# Patient Record
Sex: Female | Born: 1935 | Race: White | Hispanic: No | Smoking: Never smoker
Health system: Southern US, Community
[De-identification: ages and names within clinical notes are randomized; demographics above are authoritative.]

## PROBLEM LIST (undated history)

## (undated) DIAGNOSIS — D649 Anemia, unspecified: Secondary | ICD-10-CM

## (undated) DIAGNOSIS — I10 Essential (primary) hypertension: Secondary | ICD-10-CM

## (undated) DIAGNOSIS — J449 Chronic obstructive pulmonary disease, unspecified: Secondary | ICD-10-CM

## (undated) DIAGNOSIS — R269 Unspecified abnormalities of gait and mobility: Secondary | ICD-10-CM

## (undated) DIAGNOSIS — I472 Ventricular tachycardia: Secondary | ICD-10-CM

## (undated) DIAGNOSIS — R0602 Shortness of breath: Secondary | ICD-10-CM

## (undated) DIAGNOSIS — R5381 Other malaise: Secondary | ICD-10-CM

## (undated) DIAGNOSIS — F341 Dysthymic disorder: Secondary | ICD-10-CM

## (undated) DIAGNOSIS — R251 Tremor, unspecified: Secondary | ICD-10-CM

## (undated) DIAGNOSIS — I4891 Unspecified atrial fibrillation: Secondary | ICD-10-CM

## (undated) DIAGNOSIS — I5032 Chronic diastolic (congestive) heart failure: Secondary | ICD-10-CM

## (undated) DIAGNOSIS — R5383 Other fatigue: Secondary | ICD-10-CM

## (undated) DIAGNOSIS — H409 Unspecified glaucoma: Secondary | ICD-10-CM

## (undated) DIAGNOSIS — Z950 Presence of cardiac pacemaker: Secondary | ICD-10-CM

## (undated) DIAGNOSIS — I890 Lymphedema, not elsewhere classified: Secondary | ICD-10-CM

## (undated) DIAGNOSIS — J4489 Other specified chronic obstructive pulmonary disease: Secondary | ICD-10-CM

## (undated) DIAGNOSIS — C50919 Malignant neoplasm of unspecified site of unspecified female breast: Secondary | ICD-10-CM

## (undated) DIAGNOSIS — R439 Unspecified disturbances of smell and taste: Secondary | ICD-10-CM

## (undated) DIAGNOSIS — Z8719 Personal history of other diseases of the digestive system: Secondary | ICD-10-CM

## (undated) DIAGNOSIS — R413 Other amnesia: Secondary | ICD-10-CM

## (undated) DIAGNOSIS — K219 Gastro-esophageal reflux disease without esophagitis: Secondary | ICD-10-CM

## (undated) DIAGNOSIS — I442 Atrioventricular block, complete: Secondary | ICD-10-CM

## (undated) HISTORY — DX: Gastro-esophageal reflux disease without esophagitis: K21.9

## (undated) HISTORY — DX: Other fatigue: R53.83

## (undated) HISTORY — DX: Chronic diastolic (congestive) heart failure: I50.32

## (undated) HISTORY — DX: Chronic obstructive pulmonary disease, unspecified: J44.9

## (undated) HISTORY — DX: Tremor, unspecified: R25.1

## (undated) HISTORY — DX: Atrioventricular block, complete: I44.2

## (undated) HISTORY — DX: Lymphedema, not elsewhere classified: I89.0

## (undated) HISTORY — DX: Other specified chronic obstructive pulmonary disease: J44.89

## (undated) HISTORY — PX: OTHER SURGICAL HISTORY: SHX169

## (undated) HISTORY — DX: Other amnesia: R41.3

## (undated) HISTORY — PX: SHOULDER SURGERY: SHX246

## (undated) HISTORY — DX: Other malaise: R53.81

## (undated) HISTORY — PX: PARTIAL COLECTOMY: SHX5273

## (undated) HISTORY — DX: Personal history of other diseases of the digestive system: Z87.19

## (undated) HISTORY — DX: Presence of cardiac pacemaker: Z95.0

## (undated) HISTORY — DX: Essential (primary) hypertension: I10

## (undated) HISTORY — DX: Unspecified glaucoma: H40.9

## (undated) HISTORY — DX: Unspecified disturbances of smell and taste: R43.9

## (undated) HISTORY — DX: Unspecified atrial fibrillation: I48.91

## (undated) HISTORY — DX: Ventricular tachycardia: I47.2

## (undated) HISTORY — DX: Malignant neoplasm of unspecified site of unspecified female breast: C50.919

## (undated) HISTORY — DX: Shortness of breath: R06.02

## (undated) HISTORY — PX: MASTECTOMY: SHX3

## (undated) HISTORY — DX: Unspecified abnormalities of gait and mobility: R26.9

## (undated) HISTORY — DX: Anemia, unspecified: D64.9

## (undated) HISTORY — DX: Dysthymic disorder: F34.1

---

## 2000-02-12 HISTORY — PX: MITRAL VALVE REPAIR: SHX2039

## 2006-10-07 ENCOUNTER — Encounter: Payer: Self-pay | Admitting: Cardiovascular Disease

## 2008-12-13 ENCOUNTER — Encounter: Payer: Self-pay | Admitting: Cardiovascular Disease

## 2008-12-30 ENCOUNTER — Encounter: Payer: Self-pay | Admitting: Cardiovascular Disease

## 2009-03-02 ENCOUNTER — Encounter: Payer: Self-pay | Admitting: Cardiovascular Disease

## 2009-04-05 ENCOUNTER — Encounter: Admission: RE | Admit: 2009-04-05 | Discharge: 2009-06-05 | Payer: Self-pay | Admitting: Hematology and Oncology

## 2009-05-03 ENCOUNTER — Encounter: Payer: Self-pay | Admitting: Cardiovascular Disease

## 2009-06-05 ENCOUNTER — Telehealth (INDEPENDENT_AMBULATORY_CARE_PROVIDER_SITE_OTHER): Payer: Self-pay | Admitting: *Deleted

## 2009-06-05 ENCOUNTER — Ambulatory Visit: Payer: Self-pay | Admitting: Cardiovascular Disease

## 2009-06-05 DIAGNOSIS — F341 Dysthymic disorder: Secondary | ICD-10-CM

## 2009-06-05 DIAGNOSIS — K219 Gastro-esophageal reflux disease without esophagitis: Secondary | ICD-10-CM | POA: Insufficient documentation

## 2009-06-05 DIAGNOSIS — C50919 Malignant neoplasm of unspecified site of unspecified female breast: Secondary | ICD-10-CM | POA: Insufficient documentation

## 2009-06-20 ENCOUNTER — Encounter: Payer: Self-pay | Admitting: Cardiovascular Disease

## 2009-06-21 ENCOUNTER — Telehealth: Payer: Self-pay | Admitting: Cardiovascular Disease

## 2009-06-26 ENCOUNTER — Encounter: Payer: Self-pay | Admitting: Cardiovascular Disease

## 2009-06-26 LAB — CONVERTED CEMR LAB
POC INR: 2.4
Prothrombin Time: 25.1 s

## 2009-07-28 ENCOUNTER — Ambulatory Visit: Payer: Self-pay | Admitting: Internal Medicine

## 2009-07-28 DIAGNOSIS — H409 Unspecified glaucoma: Secondary | ICD-10-CM | POA: Insufficient documentation

## 2009-07-28 DIAGNOSIS — K130 Diseases of lips: Secondary | ICD-10-CM

## 2009-07-28 DIAGNOSIS — Z8719 Personal history of other diseases of the digestive system: Secondary | ICD-10-CM

## 2009-07-28 DIAGNOSIS — I890 Lymphedema, not elsewhere classified: Secondary | ICD-10-CM | POA: Insufficient documentation

## 2009-07-28 DIAGNOSIS — D649 Anemia, unspecified: Secondary | ICD-10-CM

## 2009-07-28 LAB — CONVERTED CEMR LAB
ALT: 18 units/L (ref 0–35)
AST: 24 units/L (ref 0–37)
Albumin: 4.2 g/dL (ref 3.5–5.2)
Alkaline Phosphatase: 66 units/L (ref 39–117)
BUN: 13 mg/dL (ref 6–23)
Basophils Absolute: 0 10*3/uL (ref 0.0–0.1)
Basophils Relative: 0.3 % (ref 0.0–3.0)
Bilirubin Urine: NEGATIVE
Bilirubin, Direct: 0.1 mg/dL (ref 0.0–0.3)
CO2: 26 meq/L (ref 19–32)
Calcium: 9.9 mg/dL (ref 8.4–10.5)
Chloride: 106 meq/L (ref 96–112)
Cholesterol: 252 mg/dL — ABNORMAL HIGH (ref 0–200)
Creatinine, Ser: 0.6 mg/dL (ref 0.4–1.2)
Direct LDL: 145.9 mg/dL
Eosinophils Absolute: 0.1 10*3/uL (ref 0.0–0.7)
Eosinophils Relative: 1.6 % (ref 0.0–5.0)
Folate: >20 ng/mL
GFR calc non Af Amer: 103.27 mL/min (ref >60)
Glucose, Bld: 89 mg/dL (ref 70–99)
HCT: 43.5 % (ref 36.0–46.0)
HDL: 93.8 mg/dL (ref >39.00)
Hemoglobin: 15.5 g/dL — ABNORMAL HIGH (ref 12.0–15.0)
Iron: 125 ug/dL (ref 42–145)
Ketones, ur: NEGATIVE mg/dL
Leukocytes, UA: NEGATIVE
Lymphocytes Relative: 34 % (ref 12.0–46.0)
Lymphs Abs: 2.2 10*3/uL (ref 0.7–4.0)
MCHC: 35.6 g/dL (ref 30.0–36.0)
MCV: 95.8 fL (ref 78.0–100.0)
Monocytes Absolute: 0.6 10*3/uL (ref 0.1–1.0)
Monocytes Relative: 9.7 % (ref 3.0–12.0)
Neutro Abs: 3.6 10*3/uL (ref 1.4–7.7)
Neutrophils Relative %: 54.4 % (ref 43.0–77.0)
Nitrite: NEGATIVE
Platelets: 215 10*3/uL (ref 150.0–400.0)
Potassium: 4 meq/L (ref 3.5–5.1)
RBC: 4.54 M/uL (ref 3.87–5.11)
RDW: 13.3 % (ref 11.5–14.6)
Saturation Ratios: 33.6 % (ref 20.0–50.0)
Sodium: 141 meq/L (ref 135–145)
Specific Gravity, Urine: 1.01 (ref 1.000–1.030)
TSH: 0.87 microintl units/mL (ref 0.35–5.50)
Total Bilirubin: 0.6 mg/dL (ref 0.3–1.2)
Total CHOL/HDL Ratio: 3
Total Protein, Urine: NEGATIVE mg/dL
Total Protein: 7.6 g/dL (ref 6.0–8.3)
Transferrin: 265.5 mg/dL (ref 212.0–360.0)
Triglycerides: 114 mg/dL (ref 0.0–149.0)
Urine Glucose: NEGATIVE mg/dL
Urobilinogen, UA: 0.2 (ref 0.0–1.0)
VLDL: 22.8 mg/dL (ref 0.0–40.0)
Vitamin B-12: 756 pg/mL (ref 211–911)
WBC: 6.6 10*3/uL (ref 4.5–10.5)
pH: 5 (ref 5.0–8.0)

## 2009-08-02 ENCOUNTER — Ambulatory Visit: Payer: Self-pay

## 2009-08-02 LAB — CONVERTED CEMR LAB: POC INR: 1.7

## 2009-08-09 ENCOUNTER — Telehealth: Payer: Self-pay | Admitting: Cardiovascular Disease

## 2009-08-15 ENCOUNTER — Ambulatory Visit: Payer: Self-pay | Admitting: Internal Medicine

## 2009-08-15 DIAGNOSIS — I5032 Chronic diastolic (congestive) heart failure: Secondary | ICD-10-CM

## 2009-08-16 ENCOUNTER — Encounter: Payer: Self-pay | Admitting: Cardiovascular Disease

## 2009-08-16 ENCOUNTER — Telehealth: Payer: Self-pay | Admitting: Internal Medicine

## 2009-08-18 ENCOUNTER — Encounter: Payer: Self-pay | Admitting: Cardiovascular Disease

## 2009-08-18 ENCOUNTER — Ambulatory Visit: Payer: Self-pay | Admitting: Cardiovascular Disease

## 2009-08-18 ENCOUNTER — Ambulatory Visit (HOSPITAL_COMMUNITY): Admission: RE | Admit: 2009-08-18 | Discharge: 2009-08-18 | Payer: Self-pay | Admitting: Cardiovascular Disease

## 2009-08-18 LAB — CONVERTED CEMR LAB
BUN: 14 mg/dL (ref 6–23)
Basophils Absolute: 0 10*3/uL (ref 0.0–0.1)
Basophils Relative: 0.3 % (ref 0.0–3.0)
CO2: 28 meq/L (ref 19–32)
Calcium: 9.2 mg/dL (ref 8.4–10.5)
Chloride: 104 meq/L (ref 96–112)
Creatinine, Ser: 0.5 mg/dL (ref 0.4–1.2)
Eosinophils Absolute: 0 10*3/uL (ref 0.0–0.7)
Eosinophils Relative: 0.4 % (ref 0.0–5.0)
GFR calc non Af Amer: 114.36 mL/min (ref >60)
Glucose, Bld: 94 mg/dL (ref 70–99)
HCT: 42.4 % (ref 36.0–46.0)
Hemoglobin: 14.9 g/dL (ref 12.0–15.0)
INR: 2.9 — ABNORMAL HIGH (ref 0.8–1.0)
Lymphocytes Relative: 19 % (ref 12.0–46.0)
Lymphs Abs: 1.5 10*3/uL (ref 0.7–4.0)
MCHC: 35.1 g/dL (ref 30.0–36.0)
MCV: 96.6 fL (ref 78.0–100.0)
Monocytes Absolute: 0.7 10*3/uL (ref 0.1–1.0)
Monocytes Relative: 8.4 % (ref 3.0–12.0)
Neutro Abs: 5.9 10*3/uL (ref 1.4–7.7)
Neutrophils Relative %: 71.9 % (ref 43.0–77.0)
Platelets: 227 10*3/uL (ref 150.0–400.0)
Potassium: 4.6 meq/L (ref 3.5–5.1)
Prothrombin Time: 30.8 s — ABNORMAL HIGH (ref 9.7–11.8)
RBC: 4.39 M/uL (ref 3.87–5.11)
RDW: 13.9 % (ref 11.5–14.6)
Sodium: 139 meq/L (ref 135–145)
WBC: 8.2 10*3/uL (ref 4.5–10.5)
aPTT: 41.3 s — ABNORMAL HIGH (ref 21.7–28.8)

## 2009-08-24 ENCOUNTER — Ambulatory Visit: Payer: Self-pay | Admitting: Internal Medicine

## 2009-08-24 DIAGNOSIS — R5383 Other fatigue: Secondary | ICD-10-CM

## 2009-08-24 DIAGNOSIS — R5381 Other malaise: Secondary | ICD-10-CM

## 2009-08-24 LAB — CONVERTED CEMR LAB
BUN: 12 mg/dL (ref 6–23)
CO2: 26 meq/L (ref 19–32)
Calcium: 9.3 mg/dL (ref 8.4–10.5)
Chloride: 105 meq/L (ref 96–112)
Creatinine, Ser: 0.6 mg/dL (ref 0.4–1.2)
GFR calc non Af Amer: 105.31 mL/min (ref >60)
Glucose, Bld: 106 mg/dL — ABNORMAL HIGH (ref 70–99)
Potassium: 4 meq/L (ref 3.5–5.1)
Sodium: 139 meq/L (ref 135–145)

## 2009-08-29 ENCOUNTER — Telehealth: Payer: Self-pay | Admitting: Internal Medicine

## 2009-08-29 ENCOUNTER — Telehealth (INDEPENDENT_AMBULATORY_CARE_PROVIDER_SITE_OTHER): Payer: Self-pay | Admitting: *Deleted

## 2009-09-18 ENCOUNTER — Telehealth: Payer: Self-pay | Admitting: Internal Medicine

## 2009-09-18 ENCOUNTER — Encounter: Payer: Self-pay | Admitting: Internal Medicine

## 2009-09-19 ENCOUNTER — Encounter: Payer: Self-pay | Admitting: Cardiology

## 2009-09-19 ENCOUNTER — Ambulatory Visit: Payer: Self-pay | Admitting: Internal Medicine

## 2009-09-19 DIAGNOSIS — R000 Tachycardia, unspecified: Secondary | ICD-10-CM | POA: Insufficient documentation

## 2009-09-19 LAB — CONVERTED CEMR LAB: POC INR: 2.1

## 2009-09-21 ENCOUNTER — Ambulatory Visit: Payer: Self-pay | Admitting: Internal Medicine

## 2009-09-21 ENCOUNTER — Encounter: Payer: Self-pay | Admitting: Cardiovascular Disease

## 2009-09-21 DIAGNOSIS — R0602 Shortness of breath: Secondary | ICD-10-CM

## 2009-09-22 ENCOUNTER — Ambulatory Visit (HOSPITAL_COMMUNITY)
Admission: RE | Admit: 2009-09-22 | Discharge: 2009-09-22 | Payer: Self-pay | Source: Home / Self Care | Admitting: Cardiology

## 2009-09-22 ENCOUNTER — Telehealth: Payer: Self-pay | Admitting: Nurse Practitioner

## 2009-09-25 ENCOUNTER — Ambulatory Visit: Payer: Self-pay | Admitting: Cardiology

## 2009-09-25 LAB — CONVERTED CEMR LAB: POC INR: 3.2

## 2009-09-26 ENCOUNTER — Ambulatory Visit: Payer: Self-pay | Admitting: Internal Medicine

## 2009-09-26 ENCOUNTER — Ambulatory Visit (HOSPITAL_COMMUNITY): Admission: RE | Admit: 2009-09-26 | Discharge: 2009-09-26 | Payer: Self-pay | Admitting: Internal Medicine

## 2009-09-26 ENCOUNTER — Encounter: Payer: Self-pay | Admitting: Internal Medicine

## 2009-09-27 ENCOUNTER — Encounter: Payer: Self-pay | Admitting: Internal Medicine

## 2009-09-27 ENCOUNTER — Telehealth: Payer: Self-pay | Admitting: Internal Medicine

## 2009-09-28 ENCOUNTER — Ambulatory Visit: Payer: Self-pay | Admitting: Internal Medicine

## 2009-09-29 ENCOUNTER — Telehealth: Payer: Self-pay | Admitting: Internal Medicine

## 2009-10-02 ENCOUNTER — Encounter: Payer: Self-pay | Admitting: Internal Medicine

## 2009-10-02 ENCOUNTER — Encounter (HOSPITAL_COMMUNITY)
Admission: RE | Admit: 2009-10-02 | Discharge: 2009-11-02 | Payer: Self-pay | Source: Home / Self Care | Admitting: Internal Medicine

## 2009-10-02 ENCOUNTER — Ambulatory Visit: Payer: Self-pay

## 2009-10-02 ENCOUNTER — Ambulatory Visit: Payer: Self-pay | Admitting: Internal Medicine

## 2009-10-02 ENCOUNTER — Ambulatory Visit: Payer: Self-pay | Admitting: Cardiology

## 2009-10-02 LAB — CONVERTED CEMR LAB: POC INR: 4.9

## 2009-10-03 ENCOUNTER — Telehealth: Payer: Self-pay | Admitting: Internal Medicine

## 2009-10-09 ENCOUNTER — Ambulatory Visit: Payer: Self-pay | Admitting: Cardiology

## 2009-10-12 ENCOUNTER — Telehealth: Payer: Self-pay | Admitting: Internal Medicine

## 2009-10-19 ENCOUNTER — Ambulatory Visit: Payer: Self-pay | Admitting: Internal Medicine

## 2009-10-19 ENCOUNTER — Telehealth (INDEPENDENT_AMBULATORY_CARE_PROVIDER_SITE_OTHER): Payer: Self-pay | Admitting: *Deleted

## 2009-11-09 ENCOUNTER — Ambulatory Visit: Payer: Self-pay | Admitting: Cardiology

## 2009-11-21 ENCOUNTER — Ambulatory Visit: Payer: Self-pay | Admitting: Internal Medicine

## 2009-11-21 ENCOUNTER — Encounter: Payer: Self-pay | Admitting: Internal Medicine

## 2009-12-25 ENCOUNTER — Ambulatory Visit: Payer: Self-pay | Admitting: Internal Medicine

## 2010-01-22 ENCOUNTER — Ambulatory Visit: Payer: Self-pay | Admitting: Cardiology

## 2010-01-22 LAB — CONVERTED CEMR LAB: POC INR: 3.1

## 2010-02-06 ENCOUNTER — Telehealth: Payer: Self-pay | Admitting: Internal Medicine

## 2010-02-07 ENCOUNTER — Ambulatory Visit: Admit: 2010-02-07 | Payer: Self-pay | Admitting: Cardiology

## 2010-02-08 ENCOUNTER — Ambulatory Visit
Admission: RE | Admit: 2010-02-08 | Discharge: 2010-02-08 | Payer: Self-pay | Source: Home / Self Care | Attending: Internal Medicine | Admitting: Internal Medicine

## 2010-02-08 ENCOUNTER — Telehealth: Payer: Self-pay | Admitting: Internal Medicine

## 2010-02-08 ENCOUNTER — Encounter: Payer: Self-pay | Admitting: Internal Medicine

## 2010-02-11 HISTORY — PX: PACEMAKER INSERTION: SHX728

## 2010-02-13 LAB — CONVERTED CEMR LAB
Basophils Absolute: 0 10*3/uL (ref 0.0–0.1)
CO2: 23 meq/L (ref 19–32)
Calcium: 9.1 mg/dL (ref 8.4–10.5)
Creatinine, Ser: 0.6 mg/dL (ref 0.4–1.2)
Eosinophils Absolute: 0.1 10*3/uL (ref 0.0–0.7)
Eosinophils Relative: 1.2 % (ref 0.0–5.0)
MCV: 98.8 fL (ref 78.0–100.0)
Monocytes Absolute: 0.8 10*3/uL (ref 0.1–1.0)
Neutrophils Relative %: 73.7 % (ref 43.0–77.0)
Platelets: 147 10*3/uL — ABNORMAL LOW (ref 150.0–400.0)
Pro B Natriuretic peptide (BNP): 467.6 pg/mL — ABNORMAL HIGH (ref 0.0–100.0)
WBC: 8.6 10*3/uL (ref 4.5–10.5)

## 2010-02-19 ENCOUNTER — Ambulatory Visit: Admission: RE | Admit: 2010-02-19 | Discharge: 2010-02-19 | Payer: Self-pay | Source: Home / Self Care

## 2010-02-19 LAB — CONVERTED CEMR LAB: POC INR: 1.9

## 2010-02-27 ENCOUNTER — Encounter: Payer: Self-pay | Admitting: Internal Medicine

## 2010-02-27 ENCOUNTER — Ambulatory Visit
Admission: RE | Admit: 2010-02-27 | Discharge: 2010-02-27 | Payer: Self-pay | Source: Home / Self Care | Attending: Internal Medicine | Admitting: Internal Medicine

## 2010-02-27 ENCOUNTER — Other Ambulatory Visit: Payer: Self-pay | Admitting: Internal Medicine

## 2010-02-28 LAB — HEPATIC FUNCTION PANEL
ALT: 20 U/L (ref 0–35)
AST: 24 U/L (ref 0–37)
Albumin: 3.8 g/dL (ref 3.5–5.2)
Alkaline Phosphatase: 69 U/L (ref 39–117)
Bilirubin, Direct: 0.1 mg/dL (ref 0.0–0.3)
Total Bilirubin: 0.5 mg/dL (ref 0.3–1.2)
Total Protein: 7 g/dL (ref 6.0–8.3)

## 2010-02-28 LAB — TSH: TSH: 1.3 u[IU]/mL (ref 0.35–5.50)

## 2010-03-08 ENCOUNTER — Ambulatory Visit: Admission: RE | Admit: 2010-03-08 | Discharge: 2010-03-08 | Payer: Self-pay | Source: Home / Self Care

## 2010-03-08 LAB — CONVERTED CEMR LAB: POC INR: 2.5

## 2010-03-13 NOTE — Progress Notes (Signed)
Summary: Episodes of AFib  Phone Note Call from Patient Call back at [phone]   Reason for Call: Talk to Nurse Initial call taken by: Judie Grieve,  August 09, 2009 11:09 AM  Follow-up for Phone Call        pt having episodes of afib, request call back, Migdalia Dk  August 09, 2009 3:54 PM   I spoke with the pt and she said her heart was going "crazy" yesterday.  The pt did not count her pulse but she thinks it was over 100.  The pt feels fine today.  The pt is currently in [place].  I asked the pt if she had taken her prn metoprolol.  The pt said that she did not bring it with her on the trip.  I made her aware that her pharmacy contacted me yesterday about transferring a Rx to an [facility] pharmacy.  The pt will contact  the pharmacy. The pt said she will be back in town on Sunday and is going back out of town on Thursday.  The pt would like to see someone in the office before going back out of town next week. I will have to review schedules in the office for availability next week and then contact the pt with an appt.   Julieta Gutting, RN, BSN,  August 09, 2009 4:25 PM  pt wants to talk w/nurse re medication-said she has been in atrial fib all week-pt in [place] right now be home saturday-pls call [phone] Glynda Jaeger   I spoke with the pt and she is continuing to have problems with episodes of AFib.  The pt had another episode yesterday and then again today.  The pt was unsure if she was suppose to use Metoprolol for these episodes.  I instructed her that we do want her to take Metoprolol prn for palpitations. The pt did try the Metoprolol but it makes her tired.  The pt would like to be seen on Tuesday for evaluation.  I scheduled the pt to see Dr Graciela Husbands (DOD) on 08/15/09.     Follow-up by: Julieta Gutting, RN, BSN,  August 11, 2009 4:10 PM

## 2010-03-13 NOTE — Progress Notes (Signed)
Summary: Patient compliant  Patient complaint involving medical records not getting sent in a timely matter to [facility]. ROI received 10/19/09. Records biscom faxed to facility. [name] Flowers  October 19, 2009 4:20 PM

## 2010-03-13 NOTE — Assessment & Plan Note (Signed)
Summary: per check out/sf   Primary [name]:  Corwin Levins MD   History of Present Illness: [name] is seen in followup for AF for which she underwernt cardioversion which was undertaken somewhat urgently because of symptoms of palpitations and congestive failure manifested by fatigue and edema. Iitially she did not improve; she also suffered reverted to atrial fibrillation and underwent repeat cardioversion supported on this occasion with Rythmol. She failed to tolerate this. She failed to tolerate flecainide. She currently is not taking any antiarrhythmic drugs at all except for the fact that she mistook her flecainide for her antidepressants were last couple of days.   Notwithstanding this, she currently he is feeling much better. Her energy level is improved.   s   Current Medications (verified): 1)  Clonazepam 1 Mg Tabs (Clonazepam) .... As Needed 2)  Warfarin Sodium 5 Mg Tabs (Warfarin Sodium) .... Use As Directed By Anticoagulation Clinic 3)  Vitamin B-12 1000 Mcg Tabs (Cyanocobalamin) .... Take 1 Tablet By Mouth Once A Day 4)  Advair Diskus 250-50 Mcg/dose Aepb (Fluticasone-Salmeterol) .Marland Kitchen.. 1 Puff Two Times A Day 5)  Ambien 10 Mg Tabs (Zolpidem Tartrate) .Marland Kitchen.. 1 By Mouth At Bedtime As Needed 6)  Timolol Maleate 0.5 % Soln (Timolol Maleate) .Marland Kitchen.. 1 Drop in Both Eyes Every Morning and Evening 7)  Lotrisone 1-0.05 % Crea (Clotrimazole-Betamethasone) .... Use Asd Two Times A Day As Needed 8)  Clotrimazole 1 % Crea (Clotrimazole) .... Uad 9)  Flecainide Acetate 100 Mg Tabs (Flecainide Acetate) .... 1/2 Tab Two Times A Day X 2 Weeks Then Increase To 1 Two Times A Day  Allergies (verified): 1)  ! Codeine 2)  ! * Propafenone Hcl  Past History:  Past Medical History: Last updated: 08/24/2009 atrial fibrillation Sinus bradycardia Near-normal left ventricular function by TEE 7 2011 ACID REFLUX DISEASE (ICD-530.81) DEPRESSION/ANXIETY (ICD-300.4) BREAST CANCER (ICD-174.9) COPD  (ICD-496) chronic RUE lymphedema Diverticulitis, hx of glaucoma  -  Dr Severiano Gilbert - [place], optho Anemia-NOS - post -op - s/p transfusion  Past Surgical History: Last updated: 08/24/2009 Mitral valve repair 2002 Esophagogastrodiodenoscopy Partial colectomy Shoulder surgery Tonsillectomy Sigmoidoscopy Mastectomy right breast  Family History: Last updated: 06/15/2009 Mother deceased at age 81 -old age Father died at 92 in a car accident One sister dead at age 81 Uterine cancer  Social History: Last updated: 07/28/2009 10 children Retired - homemaker Never smoke Glass of vodka and tonic every day, occas wine with dinner No Hx of drug use Widowed   Vital Signs:  Patient profile:   77 year old female Height:      62 inches Weight:      133 pounds BMI:     24.41 Pulse rate:   60 / minute Resp:     16 per minute BP sitting:   112 / 76  (right arm)  Vitals Entered By: Marrion Coy, CNA (November 21, 2009 2:19 PM)  Physical Exam  General:  The patient was alert and oriented in no acute distress.Neck veins were flat, carotids were brisk. Lungs were clear. Heart sounds were irregular without murmurs or gallops. Abdomen was soft with active bowel sounds. There is no clubbing cyanosis or edema.   Appended Document: [facility]     Anticoagulation Management History:      Positive risk factors for bleeding include an age of 28 years or older.  The bleeding index is 'intermediate risk'.  Positive CHADS2 values include History of CHF and Age > 36 years old.  Her last INR was 2.9 ratio.  Anticoagulation responsible [name]: [name] MD, [place].  Exp: 12/2010.    Allergies: 1)  ! Codeine 2)  ! * Propafenone Hcl   Impression & Recommendations:  Problem # 1:  ATRIAL FIBRILLATION/FLUTTER (ICD-427.31) Paroxsyms of atrial fibrillation now in sinus.  she has been taking her flecanide by mistake as she did not tolerate it in Aug I am not sanguine about this time either.   However she is willing to try it and I am willing to give it a well as well. In the event that she cannot tolerated the options would either be dofetilide or amiodarone;  we. also discussed Pradaxa; however, given her age I am reluctant to make a change Her updated medication list for this problem includes:    Warfarin Sodium 5 Mg Tabs (Warfarin sodium) ..... Use as directed by anticoagulation clinic    Flecainide Acetate 100 Mg Tabs (Flecainide acetate) .Marland Kitchen... 1/2 tab two times a day x 2 weeks then increase to 1 two times a day  Problem # 2:  SHORTNESS OF BREATH (ICD-786.05) this is betteer in sinus rhtyhm  Other Orders: EKG w/ Interpretation (93000)  Anticoagulation Management Assessment/Plan:      The patient's current anticoagulation dose is Warfarin sodium 5 mg tabs: Use as directed by Anticoagulation Clinic.  The target INR is 2.0-3.0.  The next INR is due 12/07/2009.  Anticoagulation instructions were given to patient.  Results were reviewed/authorized by Weston Brass, PharmD.         Prior Anticoagulation Instructions: INR 2.3  Continue taking one tablet every day except for one-half tablet on Monday, Wednesday, and Friday.  Recheck in four weeks.    Patient Instructions: 1)  Your physician recommends that you schedule a follow-up appointment in: 3 months 2)  Your physician recommends that you continue on your current medications as directed. Please refer to the Current Medication list given to you today. 3)  Call us on Friday if you wish a refill on Flecainide 50mg  two times a day.

## 2010-03-13 NOTE — Progress Notes (Signed)
Summary: pt wants to do cardioversion sooner   Phone Note Call from Patient Call back at Home Phone [phone]   Caller: Patient Reason for Call: Talk to Nurse, Talk to Doctor Summary of Call: pt wants to have cardioversion pushed up to this Friday 08/18/2009 if possible Initial call taken by: Omer Jack,  August 16, 2009 8:58 AM  Follow-up for Phone Call        I have spoken with Verlon Au in endo. She will check the OR schedule and call me back. Sherri Rad, RN, BSN  August 16, 2009 9:14 AM  Pt r/s to friday 08/18/09 @ 12:00pm with Dr. Eden Emms.  Follow-up by: Sherri Rad, RN, BSN,  August 16, 2009 9:21 AM

## 2010-03-13 NOTE — Medication Information (Signed)
Summary: ccr  Anticoagulant Therapy  Managed by: Weston Brass, PharmD Referring MD: Excell Seltzer PCP: Corwin Levins MD Supervising MD: Ladona Ridgel MD, Sharlot Gowda Indication 1: Atrial Fibrillation Indication 2: Mitral Valve Disorder  Lab Used: [facility] [place] Site: [address] INR POC 2.9 INR RANGE 2.0-3.0  Dietary changes: no    Health status changes: no    Bleeding/hemorrhagic complications: no    Recent/future hospitalizations: no    Any changes in medication regimen? no    Recent/future dental: no  Any missed doses?: no       Is patient compliant with meds? yes       Allergies: 1)  ! Codeine 2)  ! * Propafenone Hcl  Anticoagulation Management History:      The patient is taking warfarin and comes in today for a routine follow up visit.  Positive risk factors for bleeding include an age of 75 years or older.  The bleeding index is 'intermediate risk'.  Positive CHADS2 values include History of CHF and Age > 70 years old.  Her last INR was 2.9 ratio.  Anticoagulation responsible provider: Ladona Ridgel MD, Sharlot Gowda.  INR POC: 2.9.  Cuvette Lot#: [id number].  Exp: 01/2011.    Anticoagulation Management Assessment/Plan:      The patient's current anticoagulation dose is Warfarin sodium 5 mg tabs: Use as directed by Anticoagulation Clinic.  The target INR is 2.0-3.0.  The next INR is due 01/22/2010.  Anticoagulation instructions were given to patient.  Results were reviewed/authorized by Weston Brass, PharmD.  She was notified by Hoy Register, PharmD Candidate.         Prior Anticoagulation Instructions: INR 2.3  Continue taking one tablet every day except for one-half tablet on Monday, Wednesday, and Friday.  Recheck in four weeks.   Current Anticoagulation Instructions: INR 2.9 Continue previous dose of 1 tablet everyday except 0.5 tablet on Monday, Wednesday and Friday. Recheck INR in 4 weeks.

## 2010-03-13 NOTE — Medication Information (Signed)
Summary: rov/ewj  Anticoagulant Therapy  Managed by: Weston Brass, PharmD Referring MD: Excell Seltzer PCP: Corwin Levins MD Supervising MD: Antoine Poche MD, Fayrene Fearing Indication 1: Atrial Fibrillation Indication 2: Mitral Valve Disorder  Lab Used: [facility] [place] Site: [address] INR POC 4.9 INR RANGE 2.0-3.0  Dietary changes: no    Health status changes: no    Bleeding/hemorrhagic complications: no    Recent/future hospitalizations: yes       Details: Cardioversion last week  Any changes in medication regimen? no    Recent/future dental: no  Any missed doses?: no       Is patient compliant with meds? yes       Allergies: 1)  ! Codeine  Anticoagulation Management History:      The patient is taking warfarin and comes in today for a routine follow up visit.  Positive risk factors for bleeding include an age of 75 years or older.  The bleeding index is 'intermediate risk'.  Positive CHADS2 values include History of CHF and Age > 92 years old.  Her last INR was 2.9 ratio.  Anticoagulation responsible provider: Antoine Poche MD, Fayrene Fearing.  INR POC: 4.9.  Cuvette Lot#: [id number].  Exp: 11/2010.    Anticoagulation Management Assessment/Plan:      The patient's current anticoagulation dose is Warfarin sodium 5 mg tabs: Use as directed by Anticoagulation Clinic.  The target INR is 2.0-3.0.  The next INR is due 10/09/2009.  Anticoagulation instructions were given to patient.  Results were reviewed/authorized by Weston Brass, PharmD.  She was notified by Liana Gerold, PharmD Candidate.         Prior Anticoagulation Instructions: INR 3.2  Continue taking 1 tablet (5mg ) every day except take 1/2 tablet (2.5mg ) on Mondays, Wednesdays, and Fridays.  Recheck next week.   Current Anticoagulation Instructions: INR 4.9  Skip today's and tomorrow's dose of Coumadin.  On Wednesday begin with 1/2 tablet daily except 1 tablet on Sun, Tue and Thu.  Retun to clinic in 1 week.

## 2010-03-13 NOTE — Assessment & Plan Note (Signed)
Summary: pt having issues with AFib/lwb   Primary Provider:  Corwin Levins MD  CC:  pt having difficulty with afib and pt has a new bruise on right leg inside and outside near the knee.  Marland Kitchen  History of Present Illness: Mrs  [name] is an 77 year-old woman seen today by the doctor the day because of problems with atrial fibrillation.  His history of mitral valve disease and underwent repair minimally invasive approach at [facility] in 2001. In 2009 or 10 she developed atrial fibrillation with a rapid rate ventricular response and was treated with dronaderone simply discontinued because of itching and was then treated ongoing lay with his Coumadin. When she saw Dr. Excell Seltzer to establish care in April she had had no intercurrent palpitations.   she comes in today because of complaints of palpitations weakness and peripheral edema which has been present over the last couple of weeks and worsening. Exertion makes the palpitations worse   her INR last week was 1.7    Current Medications (verified): 1)  Clonazepam 1 Mg Tabs (Clonazepam) .... As Needed 2)  Warfarin Sodium 5 Mg Tabs (Warfarin Sodium) .... Use As Directed By Anticoagulation Clinic 3)  Vitamin B-12 1000 Mcg Tabs (Cyanocobalamin) .... Take 1 Tablet By Mouth Once A Day 4)  Metoprolol Tartrate 50 Mg Tabs (Metoprolol Tartrate) .... Take As Needed For Palpitations Pt Has Been Taking It For The Last Week. 5)  Advair Diskus 250-50 Mcg/dose Aepb (Fluticasone-Salmeterol) .Marland Kitchen.. 1 Puff Two Times A Day 6)  Ambien 10 Mg Tabs (Zolpidem Tartrate) .Marland Kitchen.. 1 By Mouth At Bedtime 7)  Timolol Maleate 0.5 % Soln (Timolol Maleate) .Marland Kitchen.. 1 Drop in Both Eyes Every Morning and Evening 8)  Lotrisone 1-0.05 % Crea (Clotrimazole-Betamethasone) .... Use Asd Two Times A Day As Needed 9)  Clotrimazole 1 % Crea (Clotrimazole) .... Uad  Allergies (verified): 1)  ! Codeine  Past History:  Past Medical History: Last updated: 07/28/2009 ATRIAL FIBRILLATION  (ICD-427.31) ACID REFLUX DISEASE (ICD-530.81) DEPRESSION/ANXIETY (ICD-300.4) BREAST CANCER (ICD-174.9) COPD (ICD-496) chronic RUE lymphedema Diverticulitis, hx of glaucoma  -  Dr Severiano Gilbert - [place], optho Anemia-NOS - post -op - s/p transfusion  Past Surgical History: Last updated: 2009-06-15 Heart valve repair 2002 Esophagogastrodiodenoscopy Partial colectomy Shoulder surgery Tonsillectomy Sigmoidoscopy Mastectomy right breast  Family History: Last updated: Jun 15, 2009 Mother deceased at age 64 -old age Father died at 3 in a car accident One sister dead at age 74 Uterine cancer  Social History: Last updated: 07/28/2009 10 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 Lungs were clear. Heart sounds wererapid and regular with a 2/6 murmur. Abdomen was soft with active bowel sounds. There is no clubbing cyanosis 1+ peripheral edema    EKG  Procedure date:  08/15/2009  Findings:      atrial flutter-atypical with upright flutter waves in  lead V1 and isolative flutter waves in the inferior leads atrial cycle length 220 ms and ventricular rate 137 beats per minute  Impression & Recommendations:  Problem # 1:  ATRIAL FIBRILLATION/FLUTTER (ICD-427.31) the patient is manifesting atrial flutter with a rapid ventricular response persisting despite taking her metoprolol. We discussed treatment options including TEE guided cardioversion versus 3 weeks of antecedent Coumadin  and cardioversion based on her subtherapeutic INR most recently obtained on 6/22.  We discussed this at some length as she had fair amount of anxiety concerning this because of fears of inadequate sedation. She is agreeable to proceeding next week following her return from her trip with her daughter. We'll increase her metoprolol from 50-75 mg twice daily in the interim albeit with not great enthusiasm that'll affect her atrial flutter rate.  Longer-term issues related to her arrhythmia will be determined by frequency of her events and whether the previous episode was in fact atrial fibrillation. These records will need to be obtained from [facility] Her updated medication list for this problem includes:    Warfarin Sodium 5 Mg Tabs (Warfarin sodium) ..... Use as directed by anticoagulation clinic    Metoprolol Tartrate 50 Mg Tabs (Metoprolol tartrate) .Marland Kitchen... Take as needed for palpitations pt has been taking it for the last week.  Orders: EKG w/ Interpretation (93000)  Problem # 2:  CHF (ICD-428.0) the patient is manifesting edema likely the cause was the rapid rate. Previously she reportedly had normal left ventricular function; this will be reassessed at TEE next week.  We'll begin her on low dose diuretics. Her updated medication list for this problem includes:    Warfarin Sodium 5 Mg Tabs (Warfarin sodium) ..... Use as directed by anticoagulation clinic    Metoprolol Tartrate 50 Mg Tabs (Metoprolol tartrate) .Marland Kitchen... Take as needed for palpitations pt has been taking it for the last week.    Hydrochlorothiazide 25 Mg Tabs (Hydrochlorothiazide) .Marland Kitchen... 1 tab in morning  Problem # 3:  MITRAL VALVE DISORDER STATUS POST REPAIR (ICD-424.0) this may be continuing to the substrate for her arrhythmia. Her updated medication list for this problem includes:    Metoprolol Tartrate 50 Mg Tabs (Metoprolol tartrate) .Marland Kitchen... Take as needed for palpitations pt has been taking it for the last week.     Hydrochlorothiazide 25 Mg Tabs (Hydrochlorothiazide) .Marland Kitchen... 1 tab in morning

## 2010-03-13 NOTE — Progress Notes (Signed)
Summary: Atypical A. Flutter - plans for TEE/DCCV next week  Phone Note Outgoing Call   Call placed by: Creig Hines, ANP-BC,  September 22, 2009 2:42 PM Summary of Call: Ms. [name] presented today to Short Stay @ [facility] for a DCCV 2/2 atypical aflutter.  She had an INR drawn in the office on Tuesday 8/9, which was 2.,1 however today, unfortunately, her INR was down to 1.85 and thus the DCCV was cancelled.  The patient was d/c'd from short stay and I was asked to arrange follow up, etc, after the fact.  Upon my arrival to short stay to review the pts. chart I saw that the pts ecg from this morning showed rapid atypical a. flutter @ 137 bpm.  I discussed this with the nurse that discharged her as well as with Dr. Jens Som.  I planned the following:  1.  Increase coumadin to 5mg  today, sat, sun, monday with follow up in coumadin clinic on Monday 8/15 @ 3:15pm (Usual coumadin dose has been 5mg  - 1 tab daily except mwf when she was to take a 1/2 tab). 2.  TEE and DCCV has been arranged for Tuesday 8/16 @ [facility] @ 10:30am.  The patient should arrive @ Short Stay @ 8:30am and shoud be NPO after midnight. 3.  Add Digoxin 0.125mg  daily to medication regimen. 4.  If symptoms worsen in any way over the weekend, present to [facility] for admission.  I tried to reach the patient or her dtr @ all numbers available (Pt. cell: [phone], pt. home: [phone], dtr cell: [phone]) however all numbers went to voicemail.  I left a detailed message on the pts home phone instructing her to call our office when she receives the message to go over instructions.  I also called our message nurse for the day, Scherrie Bateman, to let her know of the plan.  As I was unable to reach the patient, the Digoxin has not yet been called in and all plans will have to be reiterated upon her return call.      Appended Document: Atypical A. Flutter - plans for TEE/DCCV next week PT'S DAUGHTER AWARE OF ABOVE  MESSAGE FROM CHRIS  DAUGHTER REPEATED NEW DIRECTIONS BACK TO THIS NURSE.

## 2010-03-13 NOTE — Progress Notes (Signed)
Summary: INR Results  Phone Note Call from Patient Call back at [phone]   Caller: Patient Reason for Call: Talk to Nurse, Talk to Doctor, Lab or Test Results Summary of Call: pt calling top get lab results Initial call taken by: Omer Jack,  Jun 21, 2009 3:43 PM  Follow-up for Phone Call        INR drawn yesterday.  Fax has not been sent to our office at this time.  The pt will follow-up with the lab and make sure that they fax results to our office. Julieta Gutting, RN, BSN  Jun 21, 2009 3:49 PM  returning call, please call back [phone], Migdalia Dk  Jun 23, 2009 1:19 PM   Additional Follow-up for Phone Call Additional follow up Details #1::        talked with pt--lab results should have been faxed--I talked with Kennon Rounds in [facility] and she does not have results--pt will check with daughter and try to drive to the lab to have results faxed to our office--pt does not INR result--Anne Lankford,RN     Additional Follow-up for Phone Call Additional follow up Details #2::    INR results received today.  Spoke with pt's daughter.  Continue same dose of Coumadin.  Instructed to call when she is back in town for a f/u appt.  (Pt was not in the home to make appt at this time.) Follow-up by: Weston Brass PharmD,  Jun 26, 2009 2:04 PM

## 2010-03-13 NOTE — Letter (Signed)
Summary: Cardioversion/TEE Instructions  Architectural technologist, Main Office  [address]   [place], [place] [address]   Phone: [phone]  Fax: [phone]    Cardioversion / TEE Cardioversion Instructions  You are scheduled for a  TEE Cardioversion on THURSDAY, August 24, 2009 with Dr. Jens Som.   Please arrive at the [facility] at 11 AM  on the day of your procedure.  1)   DIET:  A)   Nothing to eat or drink after midnight except your medications with a sip of water.    2)   Come to the [place] office on TODAY for lab work. The lab at [facility] is open from 8:30 a.m. to 1:30 p.m. and 2:30 p.m. to 5:00 p.m. The lab at [facility] is open from 7:30 a.m. to 5:30 p.m. You do not have to be fasting.  3)   MAKE SURE YOU TAKE YOUR COUMADIN.        4)     YOU MAY TAKE ALL of your remaining medications with a small amount of water.    C)   START NEW medications:  HCTZ 25MG  1 TABLET DAILY      5)  Must have a responsible person to drive you home.  6)   Bring a current list of your medications and current insurance cards.   * Special Note:  Every effort is made to have your procedure done on time. Occasionally there are emergencies that present themselves at the hospital that may cause delays. Please be patient if a delay does occur.  * If you have any questions after you get home, please call the office at [phone].  Appended Document: Cardioversion/TEE Instructions Procedure r/s to 7/8 @ 12:00pm with Dr. Eden Emms. The pt is to arrive at 10:00am. The pt has been made aware of her r/s date and time.

## 2010-03-13 NOTE — Assessment & Plan Note (Signed)
Summary: NEW / MEDICARE / # / CD  RS'D FROM BUMP LIST /NWS   Vital Signs:  Patient profile:   77 year old female Height:      60.5 inches Weight:      135.38 pounds BMI:     26.10 O2 Sat:      94 % on Room air Temp:     98.1 degrees F oral Pulse rate:   66 / minute BP sitting:   132 / 82  (left arm) Cuff size:   regular  Vitals Entered ByZella Ball Ewing (July 28, 2009 2:44 PM)  O2 Flow:  Room air  Preventive Care Screening  Colonoscopy:    Date:  02/12/2007    Results:  normal   Bone Density:    Date:  02/11/2005    Results:  abnormal std dev     declines tetansu   CC: New Pt. New Medicare/RE   Primary Care Provider:  Corwin Levins MD  CC:  New Pt. New Medicare/RE.  History of Present Illness: overall doing well, has mild angular cheilitis on the left for several months; also with mild increased depressive symtpoms for several months as well - low mood, anhedonia, sleep worse but wt stable, and no suicidal ideation or worsening panic.    Problems Prior to Update: 1)  Angular Cheilitis  (ICD-528.5) 2)  Preventive Health Care  (ICD-V70.0) 3)  Anemia-nos  (ICD-285.9) 4)  Glaucoma  (ICD-365.9) 5)  Diverticulitis, Hx of  (ICD-V12.79) 6)  Lymphedema, Right Arm  (ICD-457.1) 7)  Atrial Fibrillation  (ICD-427.31) 8)  Acid Reflux Disease  (ICD-530.81) 9)  Depression/anxiety  (ICD-300.4) 10)  Breast Cancer  (ICD-174.9) 11)  COPD  (ICD-496)  Medications Prior to Update: 1)  Clonazepam 1 Mg Tabs (Clonazepam) .... As Needed 2)  Warfarin Sodium 5 Mg Tabs (Warfarin Sodium) .... Use As Directed By Anticoagulation Clinic 3)  Vitamin B-12 1000 Mcg Tabs (Cyanocobalamin) .... Take 1 Tablet By Mouth Once A Day 4)  Metoprolol Tartrate 50 Mg Tabs (Metoprolol Tartrate) .... Take As Needed For Palpitations  Current Medications (verified): 1)  Clonazepam 1 Mg Tabs (Clonazepam) .... As Needed 2)  Warfarin Sodium 5 Mg Tabs (Warfarin Sodium) .... Use As Directed By Anticoagulation  Clinic 3)  Vitamin B-12 1000 Mcg Tabs (Cyanocobalamin) .... Take 1 Tablet By Mouth Once A Day 4)  Metoprolol Tartrate 50 Mg Tabs (Metoprolol Tartrate) .... Take As Needed For Palpitations 5)  Advair Diskus 250-50 Mcg/dose Aepb (Fluticasone-Salmeterol) .Marland Kitchen.. 1 Puff Two Times A Day 6)  Ambien 10 Mg Tabs (Zolpidem Tartrate) .Marland Kitchen.. 1 By Mouth At Bedtime 7)  Timolol Maleate 0.5 % Soln (Timolol Maleate) .Marland Kitchen.. 1 Drop in Both Eyes Every Morning and Evening 8)  Sertraline Hcl 50 Mg Tabs (Sertraline Hcl) .Marland Kitchen.. 1po Once Daily 9)  Nystatin 100000 Unit/ml Susp (Nystatin) .... 5 Cc By Mouth Swish and Spit Qid X 10 Days 10)  Lotrisone 1-0.05 % Crea (Clotrimazole-Betamethasone) .... Use Asd Two Times A Day As Needed  Allergies (verified): 1)  ! Codeine  Past History:  Family History: Last updated: 2009-06-10 Mother deceased at age 73 -old age Father died at 7 in a car accident One sister dead at age 77 Uterine cancer  Social History: Last updated: 07/28/2009 10 children Retired - homemaker Never smoke Glass of vodka and tonic every day, occas wine with dinner No Hx of drug use Widowed   Past Medical History: ATRIAL FIBRILLATION (ICD-427.31) ACID REFLUX DISEASE (ICD-530.81) DEPRESSION/ANXIETY (ICD-300.4) BREAST CANCER (  ICD-174.9) COPD (ICD-496) chronic RUE lymphedema Diverticulitis, hx of glaucoma  -  Dr Severiano Gilbert - [place], optho Anemia-NOS - post -op - s/p transfusion  Past Surgical History: Reviewed history from 06/05/2009 and no changes required. Heart valve repair 2002 Esophagogastrodiodenoscopy Partial colectomy Shoulder surgery Tonsillectomy Sigmoidoscopy Mastectomy right breast  Family History: Reviewed history from 06/05/2009 and no changes required. Mother deceased at age 47 -old age Father died at 82 in a car accident One sister dead at age 56 Uterine cancer  Social History: Reviewed history from 06/05/2009 and no changes required. 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         all otherwise negative per pt -    Physical Exam  General:  alert and well-developed.   Head:  normocephalic and atraumatic.   Eyes:  vision grossly intact, pupils equal, and pupils round.   Ears:  R ear normal and L ear normal.   Nose:  no external deformity and no nasal discharge.   Mouth:  no gingival abnormalities and pharynx pink and moist.   Neck:  supple and no masses.   Lungs:  normal respiratory effort and normal breath sounds.   Heart:  normal rate and regular rhythm.   Abdomen:  soft, non-tender, and normal bowel sounds.   Msk:  no joint tenderness and no joint swelling.   Extremities:  no edema, no erythema  Neurologic:  cranial nerves II-XII intact and strength normal in all extremities.   Skin:  color normal and no rashes. except for left angular cheilits noted   Psych:  depressed affect and slightly anxious.     Impression & Recommendations:  Problem # 1:  Preventive Health Care (ICD-V70.0)  Overall doing well, age appropriate education and counseling updated and referral for appropriate preventive services done unless declined, immunizations up to date or declined, diet counseling done if overweight, urged to quit smoking if smokes , most recent labs reviewed and current ordered if appropriate, ecg reviewed or declined (interpretation per ECG scanned in the EMR if done); information regarding  Medicare Prevention requirements given if appropriate; speciality referrals updated as appropriate   Orders: TLB-BMP (Basic Metabolic Panel-BMET) (80048-METABOL) TLB-CBC Platelet - w/Differential (85025-CBCD) TLB-Hepatic/Liver Function Pnl (80076-HEPATIC) TLB-Lipid Panel (80061-LIPID) TLB-TSH (Thyroid Stimulating Hormone) (84443-TSH) TLB-Udip ONLY (81003-UDIP)  Problem # 2:  DEPRESSION/ANXIETY (ICD-300.4) for sertraline to start  Problem # 3:  ANGULAR CHEILITIS (ICD-528.5) for nytastatin and lotrisone as needed   Complete Medication List: 1)  Clonazepam 1 Mg Tabs (Clonazepam) .... As needed 2)  Warfarin Sodium 5 Mg Tabs (Warfarin sodium) .... Use as directed by anticoagulation clinic 3)  Vitamin B-12 1000 Mcg Tabs (Cyanocobalamin) .... Take 1 tablet by mouth once a day 4)  Metoprolol Tartrate 50 Mg Tabs (Metoprolol tartrate) .... Take as needed for palpitations 5)  Advair Diskus 250-50 Mcg/dose Aepb (Fluticasone-salmeterol) .Marland Kitchen.. 1 puff two times a day 6)  Ambien 10 Mg Tabs (Zolpidem tartrate) .Marland Kitchen.. 1 by mouth at bedtime 7)  Timolol Maleate 0.5 % Soln (Timolol maleate) .Marland Kitchen.. 1 drop in both  eyes every morning and evening 8)  Sertraline Hcl 50 Mg Tabs (Sertraline hcl) .Marland Kitchen.. 1po once daily 9)  Nystatin 100000 Unit/ml Susp (Nystatin) .... 5 cc by mouth swish and spit qid x 10 days 10)  Lotrisone 1-0.05 % Crea (Clotrimazole-betamethasone) .... Use asd two times a day as needed  Other Orders: Pneumococcal Vaccine (96295) Admin 1st Vaccine (28413) TLB-B12 + Folate Pnl (24401_02725-D66/YQI) TLB-IBC Pnl (Iron/FE;Transferrin) (83550-IBC)   Patient Instructions: 1)  please call for your yearly mammogram 2)  you had the pneumonia shot today 3)  Please go to the Lab in the basement for your blood and/or urine tests today  4)  Please take all new medications as prescribed - the liquid for the mouth, the cream for the mouth, and the sertraline for the mood 5)  Continue all previous medications  as before this visit  6)  Please keep your appt's with Dr Excell Seltzer as you do 7)  Please schedule a follow-up appointment in 1 year or sooner if needed Prescriptions: LOTRISONE 1-0.05 % CREA (CLOTRIMAZOLE-BETAMETHASONE) use asd two times a day as needed  #1 x 1   Entered and Authorized by:   Corwin Levins MD   Signed by:   Corwin Levins MD on 07/28/2009   Method used:   Electronically to        [facility] (retail)       [address]       [place], [place]  [address]       Ph: [phone]       Fax: [phone]   RxID:   [id number] NYSTATIN 100000 UNIT/ML SUSP (NYSTATIN) 5 cc by mouth swish and spit qid x 10 days  #1bottle x 1   Entered and Authorized by:   Corwin Levins MD   Signed by:   Corwin Levins MD on 07/28/2009   Method used:   Electronically to        [facility] (retail)       [address]       [place], [place]  [address]       Ph: [phone]       Fax: [phone]   RxID:   [id number] SERTRALINE HCL 50 MG TABS (SERTRALINE HCL) 1po once daily  #30 x 11   Entered and Authorized by:   Corwin Levins MD   Signed by:   Corwin Levins MD on 07/28/2009   Method used:   Electronically to        [facility] (retail)       [address]       [place], [place]  [address]       Ph: [phone]       Fax: [phone]   RxID:   [id number] CLONAZEPAM 1 MG TABS (CLONAZEPAM) as needed  #30 x 5   Entered and Authorized by:   Corwin Levins MD   Signed by:   Corwin Levins MD on 07/28/2009   Method used:   Print then Give to Patient   RxID:   [id number] ADVAIR DISKUS 250-50 MCG/DOSE AEPB (FLUTICASONE-SALMETEROL) 1 puff two times a day  #1 x 11   Entered and Authorized by:   Corwin Levins MD   Signed by:   Corwin Levins MD on 07/28/2009   Method used:   Electronically to        [facility] (retail)  [address]       [place], [place]  [address]       Ph:  [phone]       Fax: [phone]   RxID:   [id number]    Immunizations Administered:  Pneumonia Vaccine:    Vaccine Type: Pneumovax    Site: left deltoid    Mfr: Merck    Dose: 0.5 ml    Route: IM    Given by: Zella Ball Ewing    Exp. Date: 12/06/2010    Lot #: [id number]    VIS given: 09/09/95 version given July 28, 2009.

## 2010-03-13 NOTE — Miscellaneous (Signed)
Summary: Appointment Canceled  Appointment status changed to canceled by [employer] on 10/02/2009 10:41 AM.  Cancellation Comments --------------------- wt 136/c r/s/sob/klein/sec. horz/prec. req/saf  Appointment Information ----------------------- Appt Type:  CARDIOLOGY NUCLEAR TESTING      Date:  Thursday, October 05, 2009      Time:  9:15 AM for 15 min   Urgency:  Routine   Made By:  Pearson Grippe  To Visit:  [id number]    Reason:  wt 136/c r/s/sob/klein/sec. horz/prec. req/saf  Appt Comments ------------- -- 10/02/09 10:41: ([facility]) CANCELED -- wt 136/c r/s/sob/klein/sec. horz/prec. req/saf -- 09/28/09 16:11: ([facility]) BOOKED -- Routine CARDIOLOGY NUCLEAR TESTING at 10/05/2009 9:15 AM for 15 min wt 136/c r/s/sob/klein/sec. horz/prec. req/saf

## 2010-03-13 NOTE — Progress Notes (Signed)
Summary: rapid heartbeat since friday  Phone Note Call from Patient   Caller: Patient Reason for Call: Talk to Nurse Summary of Call: pt having rapid heartbeats since friday-denies sob, dizziness, nausea and chest pain pls advise [phone] Initial call taken by: Glynda Jaeger,  September 18, 2009 8:57 AM  Follow-up for Phone Call        since Fri pt has been feeling her heart racing, shes not sure what the rate is but it feels fast to her, no edema, no sob, she is a little fatigued, when she saw Dr Graciela Husbands 7/14 he stopped her Metoprolol due to bradycardia, will discuss w/Dr Graciela Husbands and call her back Meredith Staggers, RN  September 18, 2009 10:54 AM   Discussed w/Dr Graciela Husbands he wants pt to have a DCCV this week and a stress myoview next week to see whats her maximal achieved HR, pt is aware she will come for cvrr appt tom at 10:45 and will have an EKG as well at that time, dccv sch for Wed 8/10 at 12 w/Dr [name], will review instructions w/pt at nurse visit on 8/9 [facility], RN  September 18, 2009 11:47 AM

## 2010-03-13 NOTE — Progress Notes (Signed)
Summary: pt rtn your call  Phone Note Call from Patient Call back at Home Phone [phone]   Caller: Patient Reason for Call: Talk to Nurse, Talk to Doctor Summary of Call: pt rtn your call Initial call taken by: Omer Jack,  October 03, 2009 11:43 AM  Follow-up for Phone Call        PT WANTING DR Graciela Husbands TO REFER HER TO GP WILL CALL BACK ON THURS WITH INFO.PT VERBALIZED UNDERSTANDING. Follow-up by: Scherrie Bateman, LPN,  October 03, 2009 5:52 PM  Additional Follow-up for Phone Call Additional follow up Details #1::        PER DR Liberty Handy SEE MD AT [facility] MEDICAL PT AWARE  WITH PHONE NUMBER AND ADDRESS GIVEN.  [phone] DR Clelia Croft  DR [address]ISOV[address] RUSSO  Additional Follow-up by: Scherrie Bateman, LPN,  October 05, 2009 5:54 PM

## 2010-03-13 NOTE — Letter (Signed)
Summary: [facility] [place] [facility]  [facility]   Imported By: Roderic Ovens 06/29/2009 14:12:29  _____________________________________________________________________  External Attachment:    Type:   Image     Comment:   External Document

## 2010-03-13 NOTE — Assessment & Plan Note (Signed)
Summary: per pt move to [place]/will sign release to get records/saf   Visit Type:  Initial Consult Primary Provider:  Corwin Levins MD  CC:  Cardiology consult.  History of Present Illness: 77 year-old woman presents for initial evaluation of valvular heart disease and atrial fibrillation. She initially presented with acute shortness of breath in 2001. She had not been told of a long-standing heart murmur. She was diagnosed with mitral regurgitation and ultimately underwent mitral valve repair via a minimally invasive approach at [facility]. I don't have further details of this available at this time.  She has done well from a cardiac standpoint since then and denies further problems. She has been followed regularly by the [facility], but has now moved to [place] and wishes to establish care here.  She developed an episode of atrial fibrillation with RVR last year. She was started on warfarin for anticoagulation. She initially was treated with Multaq but was intolerant secondary to itching. She discontinued this medication after 2 weeks. She was then placed on metoprolol but discontinued this medicine on her own because of marked fatigue. She's been off of all medications for the last 2 months, with the exception of Coumadin. She denies further palpitations over recent months.  She denies chest pain or pressure. She has chronic dyspnea with exertion related to COPD. She denies edema of the legs. No other complaints at present. Of note, her INRs have not been checked recently.    Current Medications (verified): 1)  Clonazepam 1 Mg Tabs (Clonazepam) .... As Needed 2)  Warfarin Sodium 5 Mg Tabs (Warfarin Sodium) .... Use As Directed By Anticoagulation Clinic 3)  Vitamin B-12 1000 Mcg Tabs (Cyanocobalamin) .... Take 1 Tablet By Mouth Once A Day  Allergies (verified): 1)  ! Codeine  Past History:  Past medical, surgical, family and social histories  (including risk factors) reviewed, and no changes noted (except as noted below).  Past Medical History: 1. Hx of cellulitis right arm 2. Mitral regurgitation s/p MV repair 3. Breast CA s/p mastectomy 4. right arm lymphedema - chronic following mastectomy 5. COPD - dyspnea with 4 blocks or climbing stairs  Past Surgical History: Mitral valve repair Right Mastectomy  Family History: Reviewed history and no changes required. noncontributory  Social History: Reviewed history and no changes required. Widowed Lives at [facility] in [place] No tobacco Etoh - 1 drink daily  Review of Systems       Positive for leg fatigue, shortness of breath, depression, otherwise negative except as per history of present illness  Vital Signs:  Patient profile:   77 year old female Height:      62 inches Weight:      135 pounds Pulse rate:   79 / minute Pulse rhythm:   regular Resp:     18 per minute BP sitting:   130 / 90  (left arm) Cuff size:   large  Vitals Entered By: Vikki Ports (June 05, 2009 4:40 PM)  Physical Exam  General:  Pt is alert and oriented, elderly woman, in no acute distress. HEENT: normal Neck: normal carotid upstrokes without bruits, JVP normal Lungs: CTA CV: RRR without murmur or gallop Abd: soft, NT, positive BS, no bruit, no organomegaly Ext: no clubbing, cyanosis, or edema. peripheral pulses 2+ and equal. The right arm is wrapped in an Ace bandage for compression of lymphedema. Skin: warm and dry without rash    EKG  Procedure date:  06/05/2009  Findings:      Normal sinus rhythm, borderline LVH, heart rate 79 beats per minute, otherwise within normal limits.  Impression & Recommendations:  Problem # 1:  ATRIAL FIBRILLATION (ICD-427.31) The patient has paroxysmal atrial fibrillation. She is in sinus rhythm today and it appears that she is in sinus most of the time. She has been intolerant to both antiarrhythmic therapy and rate control  strategies. Fortunately, she is maintaining sinus rhythm off of AV nodal blockers. She should continue on lifelong warfarin in the setting of mitral valve disease and prior mitral valve surgery. I asked her to keep her metoprolol so they can be used on an as needed basis if she develops recurrent palpitations.  Will arrange for her to have followup with our Coumadin clinic. She is leaving town tomorrow morning so we will write her a prescription to have an INR checked since she will be gone for about a month. Her last INR was 2.3 on March 24.  Her updated medication list for this problem includes:    Warfarin Sodium 5 Mg Tabs (Warfarin sodium) ..... Use as directed by anticoagulation clinic    Metoprolol Tartrate 50 Mg Tabs (Metoprolol tartrate) .Marland Kitchen... Take as needed for palpitations  Other Orders: [address] [facility] Referral (Coumadin clinic)  Patient Instructions: 1)  Your physician recommends that you schedule a follow-up appointment in:  4 months 2)  Your physician recommends that you continue on your current medications as directed. Please refer to the Current Medication list given to you today.  Take Metoprolol 50mg  as needed for palpitations 3)  You have been referred to the Coumadin Clinic

## 2010-03-13 NOTE — Medication Information (Signed)
Summary: rov  Anticoagulant Therapy  Managed by: Cloyde Reams, RN, BSN Referring MD: Excell Seltzer PCP: Corwin Levins MD Supervising MD: Daleen Squibb MD, Maisie Fus Indication 1: Atrial Fibrillation Indication 2: Mitral Valve Disorder  Lab Used: [facility] [place] Site: [address] INR POC 2.1 INR RANGE 2.0-3.0  Dietary changes: no    Health status changes: no    Bleeding/hemorrhagic complications: no    Recent/future hospitalizations: no    Any changes in medication regimen? no    Recent/future dental: no  Any missed doses?: no       Is patient compliant with meds? yes       Allergies: 1)  ! Codeine  Anticoagulation Management History:      The patient is taking warfarin and comes in today for a routine follow up visit.  Positive risk factors for bleeding include an age of 75 years or older.  The bleeding index is 'intermediate risk'.  Positive CHADS2 values include History of CHF and Age > 35 years old.  Her last INR was 2.9 ratio.  Anticoagulation responsible [place]y Hack: Daleen Squibb MD, Maisie Fus.  INR POC: 2.1.  Cuvette Lot#: [id number].  Exp: 11/2010.    Anticoagulation Management Assessment/Plan:      The patient's current anticoagulation dose is Warfarin sodium 5 mg tabs: Use as directed by Anticoagulation Clinic.  The target INR is 2.0-3.0.  The next INR is due 10/03/2009.  Anticoagulation instructions were given to patient.  Results were reviewed/authorized by Cloyde Reams, RN, BSN.  She was notified by Cloyde Reams RN.         Prior Anticoagulation Instructions: INR 1.7  Increase dose to 1 tablet every day except 1/2 tablet on Monday, Wednesday and Friday.    Current Anticoagulation Instructions: INR 2.1  Take 1.5 tablets today, then resume same dosage 1 tablet daily except 1/2 tablet on Mondays, Wednesdays, and Fridays.  Recheck in

## 2010-03-13 NOTE — Letter (Signed)
Summary: [place] [facility]  [facility]   Imported By: Roderic Ovens 06/29/2009 14:10:40  _____________________________________________________________________  External Attachment:    Type:   Image     Comment:   External Document

## 2010-03-13 NOTE — Letter (Signed)
Summary: Cardioversion/TEE Instructions  Architectural technologist, Main Office  [address]   [place], [place] [address]   Phone: [phone]  Fax: [phone]    Cardioversion Instructions  09/18/2009 MRN: [id number]  [facility] [address] [place], [place]  [address]  Dear Ms. Bankson, You are scheduled for a Cardioversion on  Wednesday 09/20/09 with Dr. [name]   Please arrive at the [facility] at  11 a.m. / p.m. on the day of your procedure.  1)   DIET:  A)   Nothing to eat or drink after midnight except your medications with a sip of water.  B)   May have clear liquid breakfast, then nothing to eat or drink after  6:00 a.m. / p.m.      Clear liquids include:  water, broth, Sprite, Ginger Ale, black coffee, tea (no sugar),      cranberry / grape / apple juice, jello (not red), popsicle from clear juices (not red).  2)   Come to the [place] office on 09/19/09 for lab work. The lab at [facility] is open from 8:30 a.m. to 1:30 p.m. and 2:30 p.m. to 5:00 p.m. The lab at [facility] is open from 7:30 a.m. to 5:30 p.m. You do not have to be fasting.  3)   MAKE SURE YOU TAKE YOUR COUMADIN.  4)   A)   DO NOT TAKE these medications before your procedure:      ___________________________________________________________________     ___________________________________________________________________     ___________________________________________________________________  B)   YOU MAY TAKE ALL of your remaining medications with a small amount of water.    C)   START NEW medications:       ___________________________________________________________________     ___________________________________________________________________  5)  Must have a responsible person to drive you home.  6)   Bring a current list of your medications and current insurance cards.   * Special Note:  Every effort is made to have your  procedure done on time. Occasionally there are emergencies that present themselves at the hospital that may cause delays. Please be patient if a delay does occur.  * If you have any questions after you get home, please call the office at [phone].

## 2010-03-13 NOTE — Progress Notes (Signed)
  Recieved Records from [facility] in [facility] sent to [facility]  June 05, 2009 1:19 PM    Appended Document:  Did not send records to Mariane Masters to Lauren appt is for today @ 4pm

## 2010-03-13 NOTE — Progress Notes (Signed)
Summary: should she do myoview   Phone Note Call from Patient Call back at [phone]   Caller: Patient Reason for Call: Talk to Nurse, Talk to Doctor Summary of Call: yesterday she had a cardioversion and she is scheduled for a stress test wants to know if she still needs to have it done she is concerned because she just had the procedure Initial call taken by: Omer Jack,  September 27, 2009 9:39 AM  Follow-up for Phone Call        spoke w/pt she is sch to have stress test tom, she feels she wants to wait until after she sees Dr Graciela Husbands, she jsut had dccv yest and doesn't feel quit up to it yet, explained Dr Koren Bound reason for test  was to see what her max. acheived HR is but she is concerned and would like to wait until after she sees Dr Graciela Husbands tom to resch, Celine Ahr cancelled she will see Dr Graciela Husbands tom at [facility], RN  September 27, 2009 9:52 AM

## 2010-03-13 NOTE — Progress Notes (Signed)
Summary: pt having reaction to med  Phone Note Call from Patient   Caller: Patient [phone] Reason for Call: Talk to Nurse Summary of Call: pt on med dr gave her -having a reaction of fatigue and headaches-pls call [phone] Initial call taken by: Glynda Jaeger,  October 12, 2009 9:26 AM  Follow-up for Phone Call        PT HELD FLECAINIDE TODAY AND FEELS BETTER  INSTRUCTED PT TO CONT TO HOLD  AND WILL  LET DR Graciela Husbands KNOW AND WILL CALL BACK WITH NEW INSTRUCTIONS PT VERBALIZED UNDERSTANDING PT'S CELL NUMBER [phone].PER PT WAS ONLY TAKING 1/2 TAB two times a day AT THIS TIME . Follow-up by: Scherrie Bateman, LPN,  October 12, 2009 6:03 PM  Additional Follow-up for Phone Call Additional follow up Details #1::        may need to try propafenone if hasnt yet   150 tid Additional Follow-up by: Nathen May, MD, [facility],  October 17, 2009 6:35 PM     Appended Document: pt having reaction to med SPOKE WITH PT TODAY FEELS FINE NOT TAKING FLECAINIDE INFORMED PT THAT DR Graciela Husbands WOULD LIKE HER TO TRY PROPAFENONE 150 MG three times a day PER PT AT THIS TIME DOES NOT WANT TO TRY ANOTHER MED  PER PT HR CURRENLTY  DOING OKAY.PT AWARE WILL FORWARD TO DR Graciela Husbands  FOR  TX PLAN.

## 2010-03-13 NOTE — Medication Information (Signed)
Summary: rov/jk  Anticoagulant Therapy  Managed by: Eda Keys, PharmD Referring MD: Excell Seltzer PCP: Corwin Levins MD Supervising MD: Ladona Ridgel MD, Sharlot Gowda Indication 1: Atrial Fibrillation Indication 2: Mitral Valve Disorder  Lab Used: [facility] [place] Site: [address] INR POC 2.1 INR RANGE 2.0-3.0  Dietary changes: no    Health status changes: no    Bleeding/hemorrhagic complications: no    Recent/future hospitalizations: no    Any changes in medication regimen? yes       Details: Pt has stopped taking flecainide but has informed cardiologist  Recent/future dental: yes     Details: Dental cleaning appt next week, but no changes to Coumadin required.   Any missed doses?: no       Is patient compliant with meds? yes       Allergies: 1)  ! Codeine 2)  ! * Propafenone Hcl  Anticoagulation Management History:      The patient is taking warfarin and comes in today for a routine follow up visit.  Positive risk factors for bleeding include an age of 75 years or older.  The bleeding index is 'intermediate risk'.  Positive CHADS2 values include History of CHF and Age > 24 years old.  Her last INR was 2.9 ratio.  Anticoagulation responsible provider: Ladona Ridgel MD, Sharlot Gowda.  INR POC: 2.1.  Cuvette Lot#: [id number].  Exp: 12/2010.    Anticoagulation Management Assessment/Plan:      The patient's current anticoagulation dose is Warfarin sodium 5 mg tabs: Use as directed by Anticoagulation Clinic.  The target INR is 2.0-3.0.  The next INR is due 11/09/2009.  Anticoagulation instructions were given to patient.  Results were reviewed/authorized by Eda Keys, PharmD.  She was notified by Harrel Carina, PharmD candidate.        Coagulation management information includes: Pt has been taking 1 tablet on Saturdays but we had 1/2 tablet in our records. Updated computer to reflect these changes. .  Prior Anticoagulation Instructions: INR 1.6  Tonight, take an extra 1 tablet  (5mg ).  Then resume Coumadin schedule of taking 1/2 tablet (2.5mg ) every day except take 1 tablet (5mg ) on Sundays, Tuesdays, and Thursdays.  Recheck next week.   Current Anticoagulation Instructions: INR 2.1  Continue taking 1 tablet every day except for 1/2 tablet on Mondays, Wednesdays, and Fridays. Re-check INR in 3 weeks.

## 2010-03-13 NOTE — Medication Information (Signed)
Summary: rov/jaj  Anticoagulant Therapy  Managed by: Weston Brass, PharmD Referring MD: Excell Seltzer PCP: Corwin Levins MD Supervising MD: [name] MD, [place] Indication 1: Atrial Fibrillation Indication 2: Mitral Valve Disorder  Lab Used: [facility] [place] Site: [address] INR POC 2.3 INR RANGE 2.0-3.0  Dietary changes: no    Health status changes: no    Bleeding/hemorrhagic complications: no    Recent/future hospitalizations: no    Any changes in medication regimen? no    Recent/future dental: no  Any missed doses?: no       Is patient compliant with meds? yes       Allergies: 1)  ! Codeine 2)  ! * Propafenone Hcl  Anticoagulation Management History:      The patient is taking warfarin and comes in today for a routine follow up visit.  Positive risk factors for bleeding include an age of 75 years or older.  The bleeding index is 'intermediate risk'.  Positive CHADS2 values include History of CHF and Age > 37 years old.  Her last INR was 2.9 ratio.  Anticoagulation responsible provider: [name] MD, [place].  INR POC: 2.3.  Cuvette Lot#: [id number].  Exp: 12/2010.    Anticoagulation Management Assessment/Plan:      The patient's current anticoagulation dose is Warfarin sodium 5 mg tabs: Use as directed by Anticoagulation Clinic.  The target INR is 2.0-3.0.  The next INR is due 12/07/2009.  Anticoagulation instructions were given to patient.  Results were reviewed/authorized by Weston Brass, PharmD.  She was notified by Kennieth Francois.         Prior Anticoagulation Instructions: INR 2.1  Continue taking 1 tablet every day except for 1/2 tablet on Mondays, Wednesdays, and Fridays. Re-check INR in 3 weeks.   Current Anticoagulation Instructions: INR 2.3  Continue taking one tablet every day except for one-half tablet on Monday, Wednesday, and Friday.  Recheck in four weeks.

## 2010-03-13 NOTE — Progress Notes (Signed)
Summary: Pt not feeling well  Phone Note Call from Patient Call back at Home Phone [phone]   Caller: Patient Action Taken: Patient advised to go to ER Summary of Call: Pt request call  not feeling well heart beating hard Initial call taken by: Judie Grieve,  September 29, 2009 10:04 AM  Follow-up for Phone Call        PER PT FEELING SO MUCH BETTER AT THIS TIME INSTRUCTED PT TO ALLOW MORE TIME WITH NEW MED (PROPAFENONE) PT VERBALIZED  UNDERSTANDING.INSTRUCTED TO CALL BACK IF S/S WORSEN OR GO TO ER  FOR EVALUATION AND TX. Follow-up by: Scherrie Bateman, LPN,  September 29, 2009 1:07 PM

## 2010-03-13 NOTE — Assessment & Plan Note (Signed)
Summary: Cardiology Nuclear Testing  Nuclear Med Background Indications for Stress Test: Evaluation for Ischemia   History: Cardioversion, Echo  History Comments: 08/24/09 and 09/26/09- Cardioversions for A.Fib/Flutter 09/26/09- Echo- EF= 55% H/O MV repair  Symptoms: DOE, Fatigue, Palpitations    Nuclear Pre-Procedure Caffeine/Decaff Intake: 9:00 am yesterday NPO After: 6:00 PM Lungs: clear IV 0.9% NS with Angio Cath: 24g     IV Site: (L) Hand IV Started by: Irean Hong RN Chest Size (in) 40     Cup Size B     Height (in): 62 Weight (lb): 135 BMI: 24.78  Nuclear Med Study 1 or 2 day study:  1 day     Stress Test Type:  Eugenie Birks Reading MD:  Arvilla Meres, MD     Referring MD:  Excell Seltzer Resting Radionuclide:  Technetium 53m Tetrofosmin     Resting Radionuclide Dose:  11 mCi  Stress Radionuclide:  Technetium 76m Tetrofosmin     Stress Radionuclide Dose:  33 mCi   Stress Protocol   Lexiscan: 0.4 mg   Stress Test Technologist:  Milana Na EMT-P     Nuclear Technologist:  Domenic Polite CNMT  Rest Procedure  Myocardial perfusion imaging was performed at rest 45 minutes following the intravenous administration of Myoview Technetium 19m Tetrofosmin.  Stress Procedure  The patient received IV Lexiscan 0.4 mg over 15-seconds.  Myoview injected at 30-seconds.  There were no significant changes with infusion.  Quantitative spect images were obtained after a 45 minute delay.  QPS Raw Data Images:  Normal; no motion artifact; normal heart/lung ratio. Stress Images:  Normal Rest Images:  Decreased uptake in the mid to distal anterior wall suggestive of breast attenuation Subtraction (SDS):  Normal Transient Ischemic Dilatation:  0.97  (Normal <1.22)  Lung/Heart Ratio:  0.19  (Normal <0.45)  Quantitative Gated Spect Images QGS EDV:  65 ml QGS ESV:  17 ml QGS EF:  74 % QGS cine images:  Normal  Findings Normal nuclear study      Overall Impression  Exercise  Capacity: Lexiscan study with no exercise. ECG Impression: Baseline: NSR; No significant ST segment change with Lexiscan. Overall Impression: Normal stress nuclear study. Overall Impression Comments: There is a resting perfusion defect in the mid to distal anterior wall which is not present on the stress images. Likely breast attenuation at rest with normal stress perfusion.  Appended Document: Cardiology Nuclear Testing if she calls, will start flecanide, 50 mg two times a day thnaks steve

## 2010-03-13 NOTE — Miscellaneous (Signed)
Summary: Orders Update  [id number] Clinical Lists Changes  Orders: Added new Test order of TLB-CBC Platelet - w/Differential (85025-CBCD) - Signed Added new Test order of TLB-PT (Protime) (85610-PTP) - Signed Added new Test order of TLB-PTT (85730-PTTL) - Signed Added new Test order of TLB-BMP (Basic Metabolic Panel-BMET) (80048-METABOL) - Signed

## 2010-03-13 NOTE — Medication Information (Signed)
Summary: Coumadin Clinic  Anticoagulant Therapy  Managed by: Weston Brass, PharmD Referring MD: Excell Seltzer PCP: Corwin Levins MD Supervising MD: Clifton James MD, Cristal Deer Indication 1: Atrial Fibrillation Indication 2: Mitral Valve Disorder  Lab Used: [facility] [place] Site: [address] PT 25.1 INR POC 2.4 INR RANGE 2.0-3.0  Dietary changes: no    Health status changes: no    Bleeding/hemorrhagic complications: no    Recent/future hospitalizations: no    Any changes in medication regimen? no    Recent/future dental: no  Any missed doses?: no       Is patient compliant with meds? yes      Comments: Spoke with pt's daughter.  Unable to verify pt's dose except for what was on the prescription bottle.   Allergies: 1)  ! Codeine  Anticoagulation Management History:      The patient is taking warfarin and comes in today for a routine follow up visit.  Positive risk factors for bleeding include an age of 75 years or older.  The bleeding index is 'intermediate risk'.  Positive CHADS2 values include Age > 20 years old.  Prothrombin time is 25.1.  Anticoagulation responsible provider: Clifton James MD, Cristal Deer.  INR POC: 2.4.    Anticoagulation Management Assessment/Plan:      The patient's current anticoagulation dose is Warfarin sodium 5 mg tabs: Use as directed by Anticoagulation Clinic.  The next INR is due 07/24/2009.  Anticoagulation instructions were given to pt's daughter .  Results were reviewed/authorized by Weston Brass, PharmD.  She was notified by Weston Brass PharmD.         Current Anticoagulation Instructions: INR 2.4  Spoke with pt's daughter.  Continue same dose of Coumadin.  Schedule to recheck INR once pt back in town.  Unsure of pt's dose.  Rx bottle says 5mg  alternating with 2.5mg .

## 2010-03-13 NOTE — Letter (Signed)
Summary: Generic Letter  Architectural technologist, Main Office  [address]   [place], [place] [address]   Phone: [phone]  Fax: [phone]        November 21, 2009 MRN: [id number]    [facility] [address] [place], [place]  [address]    Dear Ms. Dietrick,  We have scheduled a follow-up appointment with Dr. Graciela Husbands on February 27, 2010 at 2:00 pm for you.  Please let us know if that time will not work with your schedule.      Sincerely,  Claris Gladden RN  This letter has been electronically signed by your physician.

## 2010-03-13 NOTE — Assessment & Plan Note (Signed)
Summary: ekg pending dccv on 8/10  Nurse Visit   Vital Signs:  Patient profile:   77 year old female Weight:      136 pounds Pulse rate:   142 / minute BP sitting:   122 / 82  (left arm)  Vitals Entered By: Meredith Staggers, RN (September 19, 2009 10:54 AM)  Impression & Recommendations: Pt in for EKG per Dr Graciela Husbands if pt in a-fib sch for DCCV, EKG was obtained showing a rate of 142 no sob, no chest pain, does c/o fatigue, info and EKG reviewed by Dr Daleen Squibb (DOD) unsure if pt is in a-fib w/rvr or sinus tach pt given Metoprolol Tartrate 50mg  and will restart her Metoprolol 25mg  two times a day, and return on Thur for another EKG per Dr Daleen Squibb, pt is aware and agreeable, have instructed pt on how to check her pulse and she is agreeable to do this before she takes the metoprolol, she is aware to not take it if HR is in the 60s or lower, Meredith Staggers, RN  September 19, 2009 11:00 AM   Other Orders: EKG w/ Interpretation (93000)   Patient Instructions: 1)  Restart Metoprolol Tartrate 25mg  two times a day  2)  Return on Thursday 09/21/09 at 10am for EKG   Allergies: 1)  ! Codeine  Medication Administration  Medication # 1:    Medication: Metroprolol Tartrate    Dose: 50mg      Route: po    Comments: 11:15 pts BP was 110/76    Given by: Meredith Staggers, RN (September 19, 2009 10:50 AM)  Orders Added: 1)  EKG w/ Interpretation [93000]

## 2010-03-13 NOTE — Medication Information (Signed)
Summary: rov/sp  Anticoagulant Therapy  Managed by: Weston Brass, PharmD Referring MD: Excell Seltzer PCP: Corwin Levins MD Supervising MD: Eden Emms MD, Theron Arista Indication 1: Atrial Fibrillation Indication 2: Mitral Valve Disorder  Lab Used: [facility]  Site: [address] INR POC 1.7 INR RANGE 2.0-3.0  Dietary changes: no    Health status changes: no    Bleeding/hemorrhagic complications: no    Recent/future hospitalizations: no    Any changes in medication regimen? yes       Details: off metoprolol  Recent/future dental: no  Any missed doses?: no       Is patient compliant with meds? yes      Comments: Pt actually alternates 1/2 tablet and 1 tablet every day  Allergies: 1)  ! Codeine  Anticoagulation Management History:      The patient is taking warfarin and comes in today for a routine follow up visit.  Positive risk factors for bleeding include an age of 75 years or older.  The bleeding index is 'intermediate risk'.  Positive CHADS2 values include Age > 75 years old.  Anticoagulation responsible provider: Eden Emms MD, Theron Arista.  INR POC: 1.7.  Cuvette Lot#: [id number].  Exp: 10/2010.    Anticoagulation Management Assessment/Plan:      The patient's current anticoagulation dose is Warfarin sodium 5 mg tabs: Use as directed by Anticoagulation Clinic.  The target INR is 2.0-3.0.  The next INR is due 08/17/2009.  Anticoagulation instructions were given to patient.  Results were reviewed/authorized by Weston Brass, PharmD.  She was notified by Weston Brass PharmD.         Prior Anticoagulation Instructions: INR 2.4  Spoke with pt's daughter.  Continue same dose of Coumadin.  Schedule to recheck INR once pt back in town.  Unsure of pt's dose.  Rx bottle says 5mg  alternating with 2.5mg .   Current Anticoagulation Instructions: INR 1.7  Increase dose to 1 tablet every day except 1/2 tablet on Monday, Wednesday and Friday.

## 2010-03-13 NOTE — Assessment & Plan Note (Signed)
Summary: eph/post tee per pt call/lg   Primary Provider:  Corwin Levins MD  CC:  eph/post TEE/pt reports she is breathless with no energy.  History of Present Illness: [name] is seen in followup for AF for which she underwernt cardioversion which was undertaken somewhat urgently because of symptoms of palpitations and congestive failure manifested by fatigue and edema. Somewhat surprisingly, she is not a whole lot better. She's had some problems with edema ongoingly.  Of note the diuretic that she has been taking is new. Also metoprolol which had been previously prescribed has only been recently taken on a regular basis. She has noted quite a slow heart rate and  Current Medications (verified): 1)  Clonazepam 1 Mg Tabs (Clonazepam) .... As Needed 2)  Warfarin Sodium 5 Mg Tabs (Warfarin Sodium) .... Use As Directed By Anticoagulation Clinic 3)  Vitamin B-12 1000 Mcg Tabs (Cyanocobalamin) .... Take 1 Tablet By Mouth Once A Day 4)  Metoprolol Tartrate 50 Mg Tabs (Metoprolol Tartrate) .... Take As Needed For Palpitations Pt Has Been Taking It For The Last Week. 5)  Advair Diskus 250-50 Mcg/dose Aepb (Fluticasone-Salmeterol) .Marland Kitchen.. 1 Puff Two Times A Day 6)  Ambien 10 Mg Tabs (Zolpidem Tartrate) .Marland Kitchen.. 1 By Mouth At Bedtime 7)  Timolol Maleate 0.5 % Soln (Timolol Maleate) .Marland Kitchen.. 1 Drop in Both Eyes Every Morning and Evening 8)  Lotrisone 1-0.05 % Crea (Clotrimazole-Betamethasone) .... Use Asd Two Times A Day As Needed 9)  Clotrimazole 1 % Crea (Clotrimazole) .... Uad 10)  Hydrochlorothiazide 25 Mg Tabs (Hydrochlorothiazide) .Marland Kitchen.. 1 Tab in Morning  Allergies (verified): 1)  ! Codeine  Past History:  Family History: Last updated: Jul 02, 2009 Mother deceased at age 16 -old age Father died at 70 in a car accident One sister dead at age 62 Uterine cancer  Social History: Last updated: 07/28/2009 10 children Retired - homemaker Never smoke Glass of vodka and tonic every day, occas wine  with dinner No Hx of drug use Widowed   Past Medical History: atrial fibrillation Sinus bradycardia Near-normal left ventricular function by TEE 7 2011 ACID REFLUX DISEASE (ICD-530.81) DEPRESSION/ANXIETY (ICD-300.4) BREAST CANCER (ICD-174.9) COPD (ICD-496) chronic RUE lymphedema Diverticulitis, hx of glaucoma  -  Dr Severiano Gilbert - [place], optho Anemia-NOS - post -op - s/p transfusion  Past Surgical History: Mitral valve repair 2002 Esophagogastrodiodenoscopy Partial colectomy Shoulder surgery Tonsillectomy Sigmoidoscopy Mastectomy right breast  Vital Signs:  Patient profile:   77 year old female Height:      60.5 inches Weight:      136 pounds BMI:     26.22 Pulse rate:   53 / minute Pulse rhythm:   irregular BP sitting:   134 / 78  (left arm) Cuff size:   regular  Vitals Entered By: Judithe Modest CMA (August 24, 2009 9:22 AM)  Physical Exam  General:  The patient was alert and oriented in no acute distress. HEENT Normal.  Neck veins were flat, carotids were brisk.  Lungs were clear.  Heart sounds were regular but slow without murmurs or gallops.  Abdomen was soft with active bowel sounds. There is no clubbing cyanosis or edema. Skin Warm and dry, with some ecchymoses from IV sites    Impression & Recommendations:  Problem # 1:  ATRIAL FIBRILLATION/FLUTTER (ICD-427.31) The patient is status post cardioversion. She is therapeutic on her Coumadin. This is being followed by the Coumadin clinic. We stressed the importance of close followup of this. We also reviewed the potential use of  Pradaxa as an alternative. The following medications were removed from the medication list:    Metoprolol Tartrate 50 Mg Tabs (Metoprolol tartrate) .Marland Kitchen... Take as needed for palpitations pt has been taking it for the last week. Her updated medication list for this problem includes:    Warfarin Sodium 5 Mg Tabs (Warfarin sodium) ..... Use as directed by anticoagulation  clinic  Orders: TLB-BMP (Basic Metabolic Panel-BMET) (80048-METABOL)  Problem # 2:  CHF (ICD-428.0) She has no evidence today of fluid overload. We'll plan to change her diuretic to take as needed.  I should note that her TEE showed near-normal left ventricular function although not well visualized The following medications were removed from the medication list:    Metoprolol Tartrate 50 Mg Tabs (Metoprolol tartrate) .Marland Kitchen... Take as needed for palpitations pt has been taking it for the last week. Her updated medication list for this problem includes:    Warfarin Sodium 5 Mg Tabs (Warfarin sodium) ..... Use as directed by anticoagulation clinic    Hydrochlorothiazide 25 Mg Tabs (Hydrochlorothiazide) .Marland Kitchen... 1 tab in morning as needed.  Problem # 3:  FATIGUE (ICD-780.79) There are multiple potential contributors to her fatigue. The first is that she has been taking metoprolol a regular basis. We will stop it. Next is that she has significant bradycardia. This may also be related to metoprolol. We'll need to reassess her heart rate following the discontinuation of the metoprolol. She may well have significant tachybradycardia ndrome that may require backup bradycardia pacing.  She also was started on hydrochlorothiazide; the possibility that she is hypokalemic needs to be reviewed and a metabolic profile will be done   Problem # 4:  SINUS BRADYCARDIA (ICD-427.81) please see the above The following medications were removed from the medication list:    Metoprolol Tartrate 50 Mg Tabs (Metoprolol tartrate) .Marland Kitchen... Take as needed for palpitations pt has been taking it for the last week. Her updated medication list for this problem includes:    Warfarin Sodium 5 Mg Tabs (Warfarin sodium) ..... Use as directed by anticoagulation clinic  Problem # 5:  ABNORMAL ELECTROCARDIOGRAM (ICD-794.31) the loss of R waves in the anterior precordium is almost certainly lead placement there this will be repeated at her  next visit. The following medications were removed from the medication list:    Metoprolol Tartrate 50 Mg Tabs (Metoprolol tartrate) .Marland Kitchen... Take as needed for palpitations pt has been taking it for the last week. Her updated medication list for this problem includes:    Warfarin Sodium 5 Mg Tabs (Warfarin sodium) ..... Use as directed by anticoagulation clinic  Patient Instructions: 1)  Your physician has recommended you make the following change in your medication: Only take HCTZ as needed.  Stop Metoprolol. 2)  Your physician wants you to follow-up in: 3 months with Dr Graciela Husbands.  You will receive a reminder letter in the mail two months in advance. If you don't receive a letter, please call our office to schedule the follow-up appointment. 3)  Your physician recommends that you return for lab work today.

## 2010-03-13 NOTE — Assessment & Plan Note (Signed)
Summary: ekg  Nurse Visit   Vital Signs:  Patient profile:   77 year old female Height:      60.5 inches Weight:      136 pounds Pulse rate:   140 / minute Pulse rhythm:   irregular BP sitting:   134 / 72  (left arm) Cuff size:   regular  Vitals Entered By: Scherrie Bateman, LPN (September 21, 2009 11:02 AM)  Visit Type:  Follow-up  CC:  EKG .   Current Medications (verified): 1)  Clonazepam 1 Mg Tabs (Clonazepam) .... As Needed 2)  Warfarin Sodium 5 Mg Tabs (Warfarin Sodium) .... Use As Directed By Anticoagulation Clinic 3)  Vitamin B-12 1000 Mcg Tabs (Cyanocobalamin) .... Take 1 Tablet By Mouth Once A Day 4)  Advair Diskus 250-50 Mcg/dose Aepb (Fluticasone-Salmeterol) .Marland Kitchen.. 1 Puff Two Times A Day 5)  Ambien 10 Mg Tabs (Zolpidem Tartrate) .Marland Kitchen.. 1 By Mouth At Bedtime As Needed 6)  Timolol Maleate 0.5 % Soln (Timolol Maleate) .Marland Kitchen.. 1 Drop in Both Eyes Every Morning and Evening 7)  Lotrisone 1-0.05 % Crea (Clotrimazole-Betamethasone) .... Use Asd Two Times A Day As Needed 8)  Clotrimazole 1 % Crea (Clotrimazole) .... Uad 9)  Metoprolol Tartrate 25 Mg Tabs (Metoprolol Tartrate) .... Take One Tablet By Mouth Twice A Day  Allergies: 1)  ! Codeine  Orders Added: 1)  EKG w/ Interpretation [93000] 2)  Nuclear Stress Test [Nuc Stress Test]   Patient Instructions: 1)  Your physician recommends that you schedule a follow-up appointment in: ASAP WITH DR Graciela Husbands 2)  Your physician has recommended you make the following change in your medication PER DR COOPER INCREASE METOPROLOL TO 50 MG two times a day  3)  Your physician has requested that you have an exercise stress myoview.  For further information please visit [email or web address]  Please follow instruction sheet, as given.

## 2010-03-13 NOTE — Assessment & Plan Note (Signed)
Summary: a- fib, sob /mt   Visit Type:  rov Primary Provider:  Corwin Levins MD  CC:  sob COPD....denies any cp or edema...Marland Kitchenpt states she has started on propafenone and this has been causing what she calls "fuzzy tongue" says she drinks a glass of water and this goes away..  History of Present Illness: [name] is seen in followup for AF for which she underwernt cardioversion which was undertaken somewhat urgently because of symptoms of palpitations and congestive failure manifested by fatigue and edema. Iitially she did not improve; she also suffered reverted to atrial fibrillation and underwent repeat cardioversion supported on this occasion with Rythmol.  She currently he is feeling much better. Her energy level is improved.   she is scheduled for Myoview stress testing on Monday. She notes that her heart rates are slow and I wonder to what degree she may have chronotropic incompetence  Current Medications (verified): 1)  Clonazepam 1 Mg Tabs (Clonazepam) .... As Needed 2)  Warfarin Sodium 5 Mg Tabs (Warfarin Sodium) .... Use As Directed By Anticoagulation Clinic 3)  Vitamin B-12 1000 Mcg Tabs (Cyanocobalamin) .... Take 1 Tablet By Mouth Once A Day 4)  Advair Diskus 250-50 Mcg/dose Aepb (Fluticasone-Salmeterol) .Marland Kitchen.. 1 Puff Two Times A Day 5)  Ambien 10 Mg Tabs (Zolpidem Tartrate) .Marland Kitchen.. 1 By Mouth At Bedtime As Needed 6)  Timolol Maleate 0.5 % Soln (Timolol Maleate) .Marland Kitchen.. 1 Drop in Both Eyes Every Morning and Evening 7)  Lotrisone 1-0.05 % Crea (Clotrimazole-Betamethasone) .... Use Asd Two Times A Day As Needed 8)  Clotrimazole 1 % Crea (Clotrimazole) .... Uad 9)  Propafenone Hcl 300 Mg Tabs (Propafenone Hcl) .Marland Kitchen.. 1 Tab Two Times A Day  Allergies: 1)  ! Codeine  Past History:  Past Medical History: Last updated: 08/24/2009 atrial fibrillation Sinus bradycardia Near-normal left ventricular function by TEE 7 2011 ACID REFLUX DISEASE (ICD-530.81) DEPRESSION/ANXIETY  (ICD-300.4) BREAST CANCER (ICD-174.9) COPD (ICD-496) chronic RUE lymphedema Diverticulitis, hx of glaucoma  -  Dr Severiano Gilbert - [place], optho Anemia-NOS - post -op - s/p transfusion  Vital Signs:  Patient profile:   77 year old female Height:      60.5 inches Weight:      139.8 pounds BMI:     26.95 Pulse rate:   56 / minute Pulse rhythm:   irregular BP sitting:   112 / 70  (left arm) Cuff size:   regular  Vitals Entered By: Danielle Rankin, CMA (September 28, 2009 1:13 PM)  Physical Exam  General:  The patient was alert and oriented in no acute distress. HEENT Normal.  Neck veins were flat, carotids were brisk. tongue is normally papillated Lungs were clear.  Heart sounds were regular but slow without murmurs or gallops.  Abdomen was soft with active bowel sounds. There is no clubbing cyanosis or edema. Skin Warm and dry,    EKG  Procedure date:  09/28/2009  Findings:      sinus rhythm at 56 Controlled 0.25/0.09/24 8 Axis is -30 Poor R-wave progression Nonspecific ST-T changes  Impression & Recommendations:  Problem # 1:  DYSGEUSIA (ICD-781.1) the patient has dysgeusia which is likely attributable to her her path and on. At this point is not so problematic that she is going to continue with medication for right now.  she is advised to let us know if the symptoms progress.  Problem # 2:  ABNORMAL ELECTROCARDIOGRAM (ICD-794.31) her electrocardiogram shows loss of anterior R waves. I suspect that this is  a lead placement issue as I review the 2 cardiograms from both April and July and these findings are not evident. Myoview scanning will be important because of these 1C antiarrhythmic and this is scheduled for next week. The following medications were removed from the medication list:    Metoprolol Tartrate 25 Mg Tabs (Metoprolol tartrate) .Marland Kitchen... Take one tablet by mouth twice a day Her updated medication list for this problem includes:    Warfarin Sodium 5 Mg Tabs  (Warfarin sodium) ..... Use as directed by anticoagulation clinic    Propafenone Hcl 300 Mg Tabs (Propafenone hcl) .Marland Kitchen... 1 tab two times a day  Problem # 3:  ATRIAL FIBRILLATION/FLUTTER (ICD-427.31) the patient has atrial fibrillation and is status post cardioversion now x2. She is currently on Rythmol for maintenance of sinus rhythm. We have discussed the likelihood that atrial fibrillation will recur. Also discussed the hope that it will recur less frequently.  She is on Rythmol and is scheduled to undergo stress testing next week to look for coronary perfusion abnormalities as well to make sure it high heart rates there is no change in the QRS complex  Orders: EKG w/ Interpretation (93000)  Problem # 4:  SHORTNESS OF BREATH (ICD-786.05) improve with maintenance of sinus rhtyh,m The following medications were removed from the medication list:    Metoprolol Tartrate 25 Mg Tabs (Metoprolol tartrate) .Marland Kitchen... Take one tablet by mouth twice a day    Digoxin 0.125 Mg Tabs (Digoxin) .Marland Kitchen... 1 once daily  Problem # 5:  SINUS BRADYCARDIA (ICD-427.81) looking forward to the results to assess the possiblity of chronotropic incompetence and the potential benefits of pacing wree touched on The following medications were removed from the medication list:    Metoprolol Tartrate 25 Mg Tabs (Metoprolol tartrate) .Marland Kitchen... Take one tablet by mouth twice a day Her updated medication list for this problem includes:    Warfarin Sodium 5 Mg Tabs (Warfarin sodium) ..... Use as directed by anticoagulation clinic    Propafenone Hcl 300 Mg Tabs (Propafenone hcl) .Marland Kitchen... 1 tab two times a day  Patient Instructions: 1)  Your physician recommends that you schedule a follow-up appointment in: 6-8 weeks with Dr. Graciela Husbands 2)  Your physician recommends that you continue on your current medications as directed. Please refer to the Current Medication list given to you today.

## 2010-03-13 NOTE — Medication Information (Signed)
Summary: rov/sp  Anticoagulant Therapy  Managed by: Weston Brass, PharmD Referring MD: Excell Seltzer PCP: Corwin Levins MD Supervising MD: Myrtis Ser MD, Tinnie Gens Indication 1: Atrial Fibrillation Indication 2: Mitral Valve Disorder  Lab Used: [facility] Site: [address] INR POC 1.6 INR RANGE 2.0-3.0  Dietary changes: no    Health status changes: no    Bleeding/hemorrhagic complications: no    Recent/future hospitalizations: no    Any changes in medication regimen? yes       Details: Pt started Flecainide last Tuesday.   Recent/future dental: no  Any missed doses?: no       Is patient compliant with meds? yes       Allergies: 1)  ! Codeine 2)  ! * Propafenone Hcl  Anticoagulation Management History:      The patient is taking warfarin and comes in today for a routine follow up visit.  Positive risk factors for bleeding include an age of 75 years or older.  The bleeding index is 'intermediate risk'.  Positive CHADS2 values include History of CHF and Age > 42 years old.  Her last INR was 2.9 ratio.  Anticoagulation responsible provider: Myrtis Ser MD, Tinnie Gens.  INR POC: 1.6.  Cuvette Lot#: [id number].  Exp: 11/2010.    Anticoagulation Management Assessment/Plan:      The patient's current anticoagulation dose is Warfarin sodium 5 mg tabs: Use as directed by Anticoagulation Clinic.  The target INR is 2.0-3.0.  The next INR is due 10/19/2009.  Anticoagulation instructions were given to patient.  Results were reviewed/authorized by Weston Brass, PharmD.  She was notified by Gweneth Fritter, PharmD Candidate.         Prior Anticoagulation Instructions: INR 4.9  Skip today's and tomorrow's dose of Coumadin.  On Wednesday begin with 1/2 tablet daily except 1 tablet on Sun, Tue and Thu.  Retun to clinic in 1 week.    Current Anticoagulation Instructions: INR 1.6  Tonight, take an extra 1 tablet (5mg ).  Then resume Coumadin schedule of taking 1/2 tablet (2.5mg ) every day except  take 1 tablet (5mg ) on Sundays, Tuesdays, and Thursdays.  Recheck next week.

## 2010-03-13 NOTE — Progress Notes (Signed)
Summary: Overdue for coumadin follow up   Phone Note Outgoing Call   Call placed by: Bethena Midget, RN, BSN,  August 29, 2009 3:41 PM Details for Reason: CVRR Follow up  Summary of Call: Spoke with pt and informed her that Dr Graciela Husbands wanted to ensure she has her coumadin levels followed and that she missed an appt on 7/7. She doesn't want to make an appt because she is going out of town 7/21 will call on 7/25 when she returns.  Initial call taken by: Bethena Midget, RN, BSN,  August 29, 2009 3:43 PM

## 2010-03-13 NOTE — Miscellaneous (Signed)
Summary: Appointment Canceled  Appointment status changed to canceled by [employer] on 09/27/2009 9:54 AM.  Cancellation Comments --------------------- wt 136/c r/s/sob/klein/sec. horz/prec. req/saf  Appointment Information ----------------------- Appt Type:  CARDIOLOGY NUCLEAR TESTING      Date:  Thursday, September 28, 2009      Time:  9:30 AM for 15 min   Urgency:  Routine   Made By:  Pearson Grippe  To Visit:  [id number]    Reason:  wt 136/c r/s/sob/klein/sec. horz/prec. req/saf  Appt Comments ------------- -- 09/27/09 9:54: ([facility]) CANCELED -- wt 136/c r/s/sob/klein/sec. horz/prec. req/saf -- 09/25/09 10:04: ([facility]) BOOKED -- Routine CARDIOLOGY NUCLEAR TESTING at 09/28/2009 9:30 AM for 15 min wt 136/c r/s/sob/klein/sec. horz/prec. req/saf -- 09/21/09 11:11:

## 2010-03-13 NOTE — Progress Notes (Signed)
Summary: medication Refill  Phone Note Refill Request Message from:  Fax from Pharmacy on August 29, 2009 11:47 AM  Refills Requested: Medication #1:  AMBIEN 10 MG TABS 1 by mouth at bedtime   Dosage confirmed as above?Dosage Confirmed   Notes: [facility] Initial call taken by: Zella Ball Ewing CMA Duncan Dull),  August 29, 2009 11:47 AM  Follow-up for Phone Call        done hardcopy to [facility] side B - dahlia  Follow-up by: Corwin Levins MD,  August 29, 2009 1:14 PM  Additional Follow-up for Phone Call Additional follow up Details #1::        Rx faxed to pharmacy Additional Follow-up by: Margaret Pyle, CMA,  August 29, 2009 1:22 PM    New/Updated Medications: AMBIEN 10 MG TABS (ZOLPIDEM TARTRATE) 1 by mouth at bedtime as needed Prescriptions: AMBIEN 10 MG TABS (ZOLPIDEM TARTRATE) 1 by mouth at bedtime as needed  #30 x 5   Entered and Authorized by:   Corwin Levins MD   Signed by:   Corwin Levins MD on 08/29/2009   Method used:   Print then Give to Patient   RxID:   [id number]

## 2010-03-13 NOTE — Medication Information (Signed)
Summary: CCR PER [facility]  Anticoagulant Therapy  Managed by: Weston Brass, PharmD Referring MD: Excell Seltzer PCP: Corwin Levins MD Supervising MD: [name] MD, Dalton Indication 1: Atrial Fibrillation Indication 2: Mitral Valve Disorder  Lab Used: [facility] [place] Site: [address] INR POC 3.2 INR RANGE 2.0-3.0  Dietary changes: no    Health status changes: yes       Details: Pt was cardioverted 2 weeks ago.  Remained stable for a week and then the flutter began again.  She reports extreme fatigue and a high pulse rate.    Bleeding/hemorrhagic complications: no    Recent/future hospitalizations: no    Any changes in medication regimen? yes       Details: Started Digoxin last Thursday.  She also has been taking 1 whole tablet (5mg ) of Coumadin since last Friday.    Recent/future dental: no  Any missed doses?: no       Is patient compliant with meds? yes       Allergies: 1)  ! Codeine  Anticoagulation Management History:      The patient is taking warfarin and comes in today for a routine follow up visit.  Positive risk factors for bleeding include an age of 75 years or older.  The bleeding index is 'intermediate risk'.  Positive CHADS2 values include History of CHF and Age > 75 years old.  Her last INR was 2.9 ratio.  Anticoagulation responsible provider: [name] MD, [place].  INR POC: 3.2.  Cuvette Lot#: [id number].  Exp: 11/2010.    Anticoagulation Management Assessment/Plan:      The patient's current anticoagulation dose is Warfarin sodium 5 mg tabs: Use as directed by Anticoagulation Clinic.  The target INR is 2.0-3.0.  The next INR is due 10/03/2009.  Anticoagulation instructions were given to patient.  Results were reviewed/authorized by Weston Brass, PharmD.  She was notified by Gweneth Fritter, PharmD Candidate.         Prior Anticoagulation Instructions: INR 2.1  Take 1.5 tablets today, then resume same dosage 1 tablet daily except 1/2 tablet on Mondays,  Wednesdays, and Fridays.  Recheck in   Current Anticoagulation Instructions: INR 3.2  Continue taking 1 tablet (5mg ) every day except take 1/2 tablet (2.5mg ) on Mondays, Wednesdays, and Fridays.  Recheck next week.

## 2010-03-13 NOTE — Letter (Signed)
Summary: [facility] Note  [facility] Note   Imported By: Roderic Ovens 06/29/2009 14:12:57  _____________________________________________________________________  External Attachment:    Type:   Image     Comment:   External Document

## 2010-03-15 ENCOUNTER — Ambulatory Visit: Admit: 2010-03-15 | Payer: Self-pay

## 2010-03-15 ENCOUNTER — Ambulatory Visit: Admit: 2010-03-15 | Payer: Self-pay | Admitting: Internal Medicine

## 2010-03-15 ENCOUNTER — Encounter: Payer: Self-pay | Admitting: Internal Medicine

## 2010-03-15 ENCOUNTER — Encounter: Payer: Self-pay | Admitting: Cardiology

## 2010-03-15 ENCOUNTER — Other Ambulatory Visit (INDEPENDENT_AMBULATORY_CARE_PROVIDER_SITE_OTHER): Payer: Medicare Other

## 2010-03-15 ENCOUNTER — Other Ambulatory Visit: Payer: Self-pay

## 2010-03-15 ENCOUNTER — Encounter (INDEPENDENT_AMBULATORY_CARE_PROVIDER_SITE_OTHER): Payer: MEDICARE

## 2010-03-15 DIAGNOSIS — I495 Sick sinus syndrome: Secondary | ICD-10-CM

## 2010-03-15 DIAGNOSIS — I509 Heart failure, unspecified: Secondary | ICD-10-CM

## 2010-03-15 DIAGNOSIS — I4891 Unspecified atrial fibrillation: Secondary | ICD-10-CM

## 2010-03-15 DIAGNOSIS — Z7901 Long term (current) use of anticoagulants: Secondary | ICD-10-CM

## 2010-03-15 LAB — CBC WITH DIFFERENTIAL/PLATELET
Basophils Absolute: 0 10*3/uL (ref 0.0–0.1)
Hemoglobin: 16.7 g/dL — ABNORMAL HIGH (ref 12.0–15.0)
Lymphocytes Relative: 24.5 % (ref 12.0–46.0)
Monocytes Relative: 8.3 % (ref 3.0–12.0)
Neutrophils Relative %: 65.5 % (ref 43.0–77.0)
Platelets: 199 10*3/uL (ref 150.0–400.0)
RDW: 14 % (ref 11.5–14.6)

## 2010-03-15 LAB — BASIC METABOLIC PANEL
Chloride: 103 mEq/L (ref 96–112)
Creatinine, Ser: 0.7 mg/dL (ref 0.4–1.2)

## 2010-03-15 LAB — APTT: aPTT: 54.4 s — ABNORMAL HIGH (ref 21.7–28.8)

## 2010-03-15 LAB — PROTIME-INR: Prothrombin Time: 56.9 s (ref 10.2–12.4)

## 2010-03-15 NOTE — Progress Notes (Signed)
Summary: c/o echo  Phone Note Call from Patient Call back at Home Phone [phone]   Caller: Patient Reason for Call: Talk to Nurse Complaint: Breathing Problems Summary of Call: per pt calling c/o sob. eye surgery on 1/4. also discuss should she have an echo.  Initial call taken by: Lorne Skeens,  February 08, 2010 9:22 AM  Follow-up for Phone Call        spoke w/pt and she states she has a cold and is extremely shob. pt also states she has copd. asked pt if she felt shob from cold and copd and she stated that is could be. she asked to be seen by Dr. Graciela Husbands or Dr. Tenny Craw. Dr. Tenny Craw had an appointment available at 1230. Pt also wondering if she needs an echo, adv her that md would determine if that was necessary.  Follow-up by: Claris Gladden RN,  February 08, 2010 10:07 AM

## 2010-03-15 NOTE — Progress Notes (Signed)
Summary: sob,with pulse rate 110  Phone Note Call from Patient Call back at [facility] Phone [phone]   Caller: Patient Summary of Call: Pt having sob pulse rate 110 Initial call taken by: Judie Grieve,  February 06, 2010 4:12 PM  Follow-up for Phone Call        Patient states that she has not felt well for about 1 week. She is complaining about her heart being out of rhythm and SOB. She states that she saw her primary care doctor today in [facility] (Dr. Leonette Most) for a swollen tongue and he gave her a mouthwash to use. He also advised her that her heart was out of rhythm with a rate of 110 per minute. He did not obtain an EKG per patient. She states that she would like to be seen in our office tomorrow. Advised her that she would have to see the DOD whom is Dr.Stuckey at 330pm tomorrow. Patient states she will be here  for that appointment.  Layne Benton, RN, BSN  February 06, 2010 5:15 PM

## 2010-03-15 NOTE — Assessment & Plan Note (Signed)
Summary: f82m fm 10.11.11   Visit Type:  77m follow up  Primary Provider:  Corwin Levins MD  CC:  shortness of breath and fatigued.  History of Present Illness: [name] is seen in followup for a total arrhythmias initially paroxysmal atrial fibrillation for which she was treated with flecainide. She was seen at the end of December and in March was undertaken demonstrating persistence of atrial flutter with 2:1 conduction over the entire monitoring period.    she has a history of normal left ventricular function and negative Myoview. She had undergone mitral repair at [facility] in early part of this Century.    Chief complaints include fatigue dyspnea on exertion lassitude loss of enthusiasm  Problems Prior to Update: 1)  Dysgeusia  (ICD-781.1) 2)  Shortness of Breath  (ICD-786.05) 3)  Unspecified Tachycardia  (ICD-785.0) 4)  Abnormal Electrocardiogram  (ICD-794.31) 5)  Sinus Bradycardia  (ICD-427.81) 6)  Fatigue  (ICD-780.79) 7)  Mitral Valve Disorder Status Post Repair  (ICD-424.0) 8)  CHF  (ICD-428.0) 9)  Angular Cheilitis  (ICD-528.5) 10)  Preventive Health Care  (ICD-V70.0) 11)  Anemia-nos  (ICD-285.9) 12)  Glaucoma  (ICD-365.9) 13)  Diverticulitis, Hx of  (ICD-V12.79) 14)  Lymphedema, Right Arm  (ICD-457.1) 15)  Atrial Fibrillation/flutter  (ICD-427.31) 16)  Acid Reflux Disease  (ICD-530.81) 17)  Depression/anxiety  (ICD-300.4) 18)  Breast Cancer  (ICD-174.9) 19)  COPD  (ICD-496)  Current Medications (verified): 1)  Clonazepam 1 Mg Tabs (Clonazepam) .... As Needed 2)  Warfarin Sodium 5 Mg Tabs (Warfarin Sodium) .... Use As Directed By Anticoagulation Clinic 3)  Vitamin B-12 1000 Mcg Tabs (Cyanocobalamin) .... Take 1 Tablet By Mouth Once A Day 4)  Spiriva Handihaler 18 Mcg Caps (Tiotropium Bromide Monohydrate) .... Uad 5)  Ambien 10 Mg Tabs (Zolpidem Tartrate) .Marland Kitchen.. 1 By Mouth At Bedtime As Needed 6)  Timolol Maleate 0.5 % Soln (Timolol Maleate) .Marland Kitchen.. 1 Drop in Both Eyes  Every Morning and Evening 7)  Flecainide Acetate 100 Mg Tabs (Flecainide Acetate) .Marland Kitchen.. 1 Two Times A Day 8)  Klor-Con M10 10 Meq Cr-Tabs (Potassium Chloride Crys Cr) .... As Directed 9)  Paxil 30 Mg Tabs (Paroxetine Hcl) .... Once Daily 10)  First-Marys Mouthwash  Susp (Diphenhyd-Hc-Nystatin-Tetracyc) .... Once Daily 11)  Besivance 0.6 % Susp (Besifloxacin Hcl) .... Use As Directed  Allergies (verified): 1)  ! Codeine 2)  ! * Propafenone Hcl  Past History:  Past Medical History: Last updated: 08/24/2009 atrial fibrillation Sinus bradycardia Near-normal left ventricular function by TEE 7 2011 ACID REFLUX DISEASE (ICD-530.81) DEPRESSION/ANXIETY (ICD-300.4) BREAST CANCER (ICD-174.9) COPD (ICD-496) chronic RUE lymphedema Diverticulitis, hx of glaucoma  -  Dr Severiano Gilbert - [place], optho Anemia-NOS - post -op - s/p transfusion  Past Surgical History: Last updated: 08/24/2009 Mitral valve repair 2002 Esophagogastrodiodenoscopy Partial colectomy Shoulder surgery Tonsillectomy Sigmoidoscopy Mastectomy right breast  Family History: Last updated: 2009/06/11 Mother deceased at age 52 -old age Father died at 50 in a car accident One sister dead at age 52 Uterine cancer  Social History: Last updated: 07/28/2009 10 children Retired - homemaker Never smoke Glass of vodka and tonic every day, occas wine with dinner No Hx of drug use Widowed   Vital Signs:  Patient profile:   77 year old female Height:      62 inches Weight:      137.50 pounds BMI:     25.24 Pulse rate:   90 / minute BP sitting:   130 / 85  (left  arm) Cuff size:   regular  Vitals Entered By: Caralee Ates CMA (February 27, 2010 2:33 PM)  Physical Exam  General:  The patient was alert and oriented in no acute distress. HEENT Normal.  Neck veins were 8-9t, carotids were brisk.  Lungs were clear.  Heart sounds were rapid and regular without murmurs or gallops.  Abdomen was soft with active bowel  sounds. There is no clubbing cyanosis or edema. Skin Warm and dry    Impression & Recommendations:  Problem # 1:  ATRIAL FIBRILLATION/FLUTTER (ICD-427.31) the patient has had atrial fibrillation atrial flutter as of at least 3 varieties. I don't know if this relates to the surgery that she had her mitral valve repair or a trapeze as a consequence of her mitral valve disease. Given her age and the multitude of arrhythmias, I am inclined to pursue a course of medical therapy with amiodarone with the possibility of backup bradycardia pacing in the event that she becomes significantly bradycardic and the possibly of AV junction ablation. I will also discuss with Dr. Macon Large the role of catheter ablation in this 77 year old woman.  A. discontinue her flecainide, begin amiodarone and we will be very attentive to the interactions with warfarin.will plan to undertake cardioversion in 2 weeks' time The following medications were removed from the medication list:    Flecainide Acetate 100 Mg Tabs (Flecainide acetate) .Marland Kitchen... 1 two times a day Her updated medication list for this problem includes:    Warfarin Sodium 5 Mg Tabs (Warfarin sodium) ..... Use as directed by anticoagulation clinic    Amiodarone Hcl 400 Mg Tabs (Amiodarone hcl) .Marland Kitchen..Marland Kitchen Two times a day  Orders: EKG w/ Interpretation (93000) TLB-Hepatic/Liver Function Pnl (80076-HEPATIC) TLB-TSH (Thyroid Stimulating Hormone) (84443-TSH)  Problem # 2:  FATIGUE (ICD-780.79) Her fatigue as well as her shortness of breath are likely manifestations at least in part of congestive heart failure. She wonders whether she is depressed. I suggested that we take a course of hydration when her heart rhythm first and then see what he can do about her symptoms prior to pursuing this course Orders: TLB-Hepatic/Liver Function Pnl (80076-HEPATIC) TLB-TSH (Thyroid Stimulating Hormone) (84443-TSH)  Problem # 3:  MITRAL VALVE DISORDER STATUS POST REPAIR  (ICD-424.0) as above Orders: TLB-Hepatic/Liver Function Pnl (80076-HEPATIC) TLB-TSH (Thyroid Stimulating Hormone) (84443-TSH)  Problem # 4:  CHF (ICD-428.0) as above The following medications were removed from the medication list:    Flecainide Acetate 100 Mg Tabs (Flecainide acetate) .Marland Kitchen... 1 two times a day Her updated medication list for this problem includes:    Warfarin Sodium 5 Mg Tabs (Warfarin sodium) ..... Use as directed by anticoagulation clinic    Amiodarone Hcl 400 Mg Tabs (Amiodarone hcl) .Marland Kitchen..Marland Kitchen Two times a day  Orders: TLB-Hepatic/Liver Function Pnl (80076-HEPATIC) TLB-TSH (Thyroid Stimulating Hormone) (84443-TSH) Prescriptions: AMIODARONE HCL 400 MG TABS (AMIODARONE HCL) two times a day  #60 x 11   Entered by:   Claris Gladden RN   Authorized by:   Nathen May, MD, [facility]   Signed by:   Claris Gladden RN on 02/27/2010   Method used:   Electronically to        [facility] (retail)       [address]       [place], [place]  [id number]       Ph: [phone]       Fax: [phone]   RxID:   [id number]

## 2010-03-15 NOTE — Medication Information (Signed)
Summary: rov/sp  Anticoagulant Therapy  Managed by: Weston Brass, PharmD Referring MD: Excell Seltzer PCP: Corwin Levins MD Supervising MD: Jens Som MD, Arlys John Indication 1: Atrial Fibrillation Indication 2: Mitral Valve Disorder  Lab Used: [facility] [place] Site: [address] INR POC 1.9 INR RANGE 2.0-3.0  Dietary changes: no    Health status changes: no    Bleeding/hemorrhagic complications: no    Recent/future hospitalizations: yes       Details: Cataract surgery   Any changes in medication regimen? no    Recent/future dental: no  Any missed doses?: no       Is patient compliant with meds? yes       Allergies: 1)  ! Codeine 2)  ! * Propafenone Hcl  Anticoagulation Management History:      The patient is taking warfarin and comes in today for a routine follow up visit.  Positive risk factors for bleeding include an age of 75 years or older.  The bleeding index is 'intermediate risk'.  Positive CHADS2 values include History of CHF and Age > 44 years old.  Her last INR was 2.9 ratio.  Anticoagulation responsible provider: Jens Som MD, Arlys John.  INR POC: 1.9.  Cuvette Lot#: [id number].  Exp: 03/2011.    Anticoagulation Management Assessment/Plan:      The patient's current anticoagulation dose is Warfarin sodium 5 mg tabs: Use as directed by Anticoagulation Clinic.  The target INR is 2.0-3.0.  The next INR is due 03/19/2010.  Anticoagulation instructions were given to patient.  Results were reviewed/authorized by Weston Brass, PharmD.  She was notified by Stephannie Peters, PharmD Candidate .         Prior Anticoagulation Instructions: INR 3.1  Continue same dose of 1 tablet every day except 1/2 tablet on Monday, Wednesday and Friday. Eat an extra salad today and tomorrow.  Recheck INR in 4 weeks.   Current Anticoagulation Instructions: INR 1.9  Coumadin 5 mg tablets - Take 1 whole tablet today, then resume 1 tablet every day except 1/2 tablet on Mondays, Wednesdays and  Fridays.

## 2010-03-15 NOTE — Medication Information (Signed)
Summary: rov/nb  Anticoagulant Therapy  Managed by: Weston Brass, PharmD Referring MD: Excell Seltzer PCP: Corwin Levins MD Supervising MD: Juanda Chance MD, Bruce Indication 1: Atrial Fibrillation Indication 2: Mitral Valve Disorder  Lab Used: [facility] [place] Site: [address] INR POC 3.1 INR RANGE 2.0-3.0  Dietary changes: no    Health status changes: no    Bleeding/hemorrhagic complications: no    Recent/future hospitalizations: no    Any changes in medication regimen? no    Recent/future dental: no  Any missed doses?: no       Is patient compliant with meds? yes       Allergies: 1)  ! Codeine 2)  ! * Propafenone Hcl  Anticoagulation Management History:      The patient is taking warfarin and comes in today for a routine follow up visit.  Positive risk factors for bleeding include an age of 75 years or older.  The bleeding index is 'intermediate risk'.  Positive CHADS2 values include History of CHF and Age > 75 years old.  Her last INR was 2.9 ratio.  Anticoagulation responsible Linley Moxley: Juanda Chance MD, Smitty Cords.  INR POC: 3.1.  Cuvette Lot#: [id number].  Exp: 02/2011.    Anticoagulation Management Assessment/Plan:      The patient's current anticoagulation dose is Warfarin sodium 5 mg tabs: Use as directed by Anticoagulation Clinic.  The target INR is 2.0-3.0.  The next INR is due 02/19/2010.  Anticoagulation instructions were given to patient.  Results were reviewed/authorized by Weston Brass, PharmD.  She was notified by Weston Brass PharmD.         Prior Anticoagulation Instructions: INR 2.9 Continue previous dose of 1 tablet everyday except 0.5 tablet on Monday, Wednesday and Friday. Recheck INR in 4 weeks.  Current Anticoagulation Instructions: INR 3.1  Continue same dose of 1 tablet every day except 1/2 tablet on Monday, Wednesday and Friday. Eat an extra salad today and tomorrow.  Recheck INR in 4 weeks.

## 2010-03-15 NOTE — Medication Information (Signed)
Summary: rov/sp  Anticoagulant Therapy  Managed by: Weston Brass, PharmD Referring MD: Excell Seltzer PCP: Corwin Levins MD Supervising MD: Myrtis Ser MD, Tinnie Gens Indication 1: Atrial Fibrillation Indication 2: Mitral Valve Disorder  Lab Used: [facility] [place] Site: [address] INR POC 2.5 INR RANGE 2.0-3.0  Dietary changes: no    Health status changes: no    Bleeding/hemorrhagic complications: no    Recent/future hospitalizations: yes       Details: Pending cardioversion   Any changes in medication regimen? yes       Details: Started Amiodarone last week  Recent/future dental: no  Any missed doses?: no       Is patient compliant with meds? yes       Allergies: 1)  ! Codeine 2)  ! * Propafenone Hcl  Anticoagulation Management History:      The patient is taking warfarin and comes in today for a routine follow up visit.  Positive risk factors for bleeding include an age of 75 years or older.  The bleeding index is 'intermediate risk'.  Positive CHADS2 values include History of CHF and Age > 63 years old.  Her last INR was 2.9 ratio.  Anticoagulation responsible provider: Myrtis Ser MD, Tinnie Gens.  INR POC: 2.5.  Cuvette Lot#: [id number].  Exp: 02/2011.    Anticoagulation Management Assessment/Plan:      The patient's current anticoagulation dose is Warfarin sodium 5 mg tabs: Use as directed by Anticoagulation Clinic.  The target INR is 2.0-3.0.  The next INR is due 03/15/2010.  Anticoagulation instructions were given to patient.  Results were reviewed/authorized by Weston Brass, PharmD.  She was notified by Stephannie Peters, PharmD Candidate .         Prior Anticoagulation Instructions: INR 1.9  Coumadin 5 mg tablets - Take 1 whole tablet today, then resume 1 tablet every day except 1/2 tablet on Mondays, Wednesdays and Fridays.   Current Anticoagulation Instructions: INR 2.5  Coumadin 5 mg tablets - Continue 1 tablet every day except 1/2 tablet on Mondays, Wednesdays and  Fridays

## 2010-03-15 NOTE — Assessment & Plan Note (Signed)
Summary: [name]   Primary [name]:  Rachael Levins MD   History of Present Illness: Patient is a 77 year old who is followed by [name].  She called into the office to say that she was SOB.  QUestioned if it was her COPD.  Note that she is due to have eye surgery next wk. The patient said her SOB is worse.  She has chronic 2 pillow orthopnea.  NOtes occasional weakness, no dizziness.  Current Medications (verified): 1)  Clonazepam 1 Mg Tabs (Clonazepam) .... As Needed 2)  Warfarin Sodium 5 Mg Tabs (Warfarin Sodium) .... Use As Directed By Anticoagulation Clinic 3)  Vitamin B-12 1000 Mcg Tabs (Cyanocobalamin) .... Take 1 Tablet By Mouth Once A Day 4)  Spiriva Handihaler 18 Mcg Caps (Tiotropium Bromide Monohydrate) .... Uad 5)  Ambien 10 Mg Tabs (Zolpidem Tartrate) .Marland Kitchen.. 1 By Mouth At Bedtime As Needed 6)  Timolol Maleate 0.5 % Soln (Timolol Maleate) .Marland Kitchen.. 1 Drop in Both Eyes Every Morning and Evening 7)  Lotrisone 1-0.05 % Crea (Clotrimazole-Betamethasone) .... Use Asd Two Times A Day As Needed 8)  Clotrimazole 1 % Crea (Clotrimazole) .... Uad 9)  Flecainide Acetate 100 Mg Tabs (Flecainide Acetate) .Marland Kitchen.. 1 Two Times A Day  Allergies (verified): 1)  ! Codeine 2)  ! * Propafenone Hcl  Past History:  Past Medical History: Last updated: 08/24/2009 atrial fibrillation Sinus bradycardia Near-normal left ventricular function by TEE 7 2011 ACID REFLUX DISEASE (ICD-530.81) DEPRESSION/ANXIETY (ICD-300.4) BREAST CANCER (ICD-174.9) COPD (ICD-496) chronic RUE lymphedema Diverticulitis, hx of glaucoma  -  Dr Severiano Gilbert - [place], optho Anemia-NOS - post -op - s/p transfusion  Review of Systems       Reviewed.  Neg to the above problem.  Vital Signs:  Patient profile:   77 year old female Height:      62 inches Weight:      140 pounds BMI:     25.70 Pulse rate:   56 / minute Resp:     16 per minute BP sitting:   142 / 74  (left arm)  Vitals Entered By: Marrion Coy, CNA (February 08, 2010 12:32 PM)  Physical Exam  Additional Exam:  Patient is in NAD HEENT:  Normocephalic, atraumatic. EOMI, PERRLA.  Neck: JVP is normal. No thyromegaly. No bruits.  Lungs: Rhonchi Heart: Regular rate and rhythm. Normal S1, S2. No S3.   No significant murmurs. PMI not displaced.  Abdomen:  Supple, nontender. Normal bowel sounds. No hepatomegaly.  Extremities:  No lower extremity edema.  Musculoskeletal :moving all extremities.  Neuro:   alert and oriented x3.    EKG  Procedure date:  02/08/2010  Findings:      PRobable sinus bradycardia.  55 bpm.  Impression & Recommendations:  Problem # 1:  SHORTNESS OF BREATH (ICD-786.05) Patient does not have signif volume overload on exam.  She has a history of atrial fibrillation and I wonder if she is having intermitt spells. LVEF is 55% by last echo. I would recomm getting labs.  Set her up for a 48 hr holtermonitor.  Continue current meds.  Problem # 2:  ATRIAL FIBRILLATION/FLUTTER (ICD-427.31) As noted above.  Other Orders: EKG w/ Interpretation (93000) TLB-BMP (Basic Metabolic Panel-BMET) (80048-METABOL) TLB-BNP (B-Natriuretic Peptide) (83880-BNPR) TLB-CBC Platelet - w/Differential (85025-CBCD) Holter Monitor (Holter Monitor)  Patient Instructions: 1)  Your physician recommends that you return for lab work in: lab work today...we will call you with results 2)  Your physician recommends that you  schedule a follow-up appointment in: follow up with Dr.Klein in January. 3)  Your physician has recommended that you wear a holter monitor.  Holter monitors are medical devices that record the heart's electrical activity. Doctors most often use these monitors to diagnose arrhythmias. Arrhythmias are problems with the speed or rhythm of the heartbeat. The monitor is a small, portable device. You can wear one while you do your normal daily activities. This is usually used to diagnose what is causing palpitations/syncope (passing  out).

## 2010-03-15 NOTE — Letter (Signed)
Summary: Cardioversion/TEE Instructions  Architectural technologist, Main Office  [address]   [place], [place] [address]   Phone: [phone]  Fax: [phone]    Cardioversion / TEE Cardioversion Instructions  02/27/2010 MRN: [id number]  [facility] [address] [place], [place]  [address]  Dear [name],  You are scheduled for a Cardioversion on March 22, 2008 at 11:00 am with Dr. Graciela Husbands.   Please arrive at the [facility] at 9:00 am on the day of your procedure.  1)   DIET:  A)   Nothing to eat or drink after midnight except your medications with a sip of water.   2)   Come to the [place] office on March 15, 2010 at 10:45 am for lab work. The lab at [facility] is open from 8:30 a.m. to 1:30 p.m. and 2:30 p.m. to 5:00 p.m. The lab at [facility] is open from 7:30 a.m. to 5:30 p.m. You do not have to be fasting.  3)   MAKE SURE YOU TAKE YOUR COUMADIN.  4)   a)   YOU MAY TAKE ALL of your remaining medications with a small amount of water.    5)  Must have a responsible person to drive you home.  6)   Bring a current list of your medications and current insurance cards.   * Special Note:  Every effort is made to have your procedure done on time. Occasionally there are emergencies that present themselves at the hospital that may cause delays. Please be patient if a delay does occur.  * If you have any questions after you get home, please call the office at [phone]. [name]

## 2010-03-21 NOTE — Medication Information (Signed)
Summary: rov/ewj  Anticoagulant Therapy  Managed by: Weston Brass, PharmD Referring MD: Excell Seltzer PCP: Corwin Levins MD Supervising MD: Myrtis Ser MD, Tinnie Gens Indication 1: Atrial Fibrillation Indication 2: Mitral Valve Disorder  Lab Used: [facility] [place] Site: [address] INR POC 5.4 INR RANGE 2.0-3.0  Dietary changes: no    Health status changes: no    Bleeding/hemorrhagic complications: no    Recent/future hospitalizations: yes       Details: pending cardioversion next week  Any changes in medication regimen? yes       Details: started amiodarone a few weeks ago  Recent/future dental: no  Any missed doses?: no       Is patient compliant with meds? yes      Comments: Pt reports actually taking 1 tablet on M, W F and 1/2 tablet all other days rather than 1/2 tablet on M, W F and 1 tablet other days.   Allergies: 1)  ! Codeine 2)  ! * Propafenone Hcl  Anticoagulation Management History:      The patient is taking warfarin and comes in today for a routine follow up visit.  Positive risk factors for bleeding include an age of 75 years or older.  The bleeding index is 'intermediate risk'.  Positive CHADS2 values include History of CHF and Age > 75 years old.  Her last INR was 2.9 ratio.  Anticoagulation responsible provider: Myrtis Ser MD, Tinnie Gens.  INR POC: 5.4.  Exp: 02/2011.    Anticoagulation Management Assessment/Plan:      The patient's current anticoagulation dose is Warfarin sodium 5 mg tabs: Use as directed by Anticoagulation Clinic.  The target INR is 2.0-3.0.  The next INR is due 03/29/2010.  Anticoagulation instructions were given to patient.  Results were reviewed/authorized by Weston Brass, PharmD.  She was notified by Weston Brass PharmD.         Prior Anticoagulation Instructions: INR 2.5  Coumadin 5 mg tablets - Continue 1 tablet every day except 1/2 tablet on Mondays, Wednesdays and Fridays   Current Anticoagulation Instructions: INR 5.4  Skip Friday  and Saturday's dose of Coumadin then decrease dsoe to 1/2 tablet every day except 1 tablet on Monday and Friday.  Recheck INR in 2 weeks.

## 2010-03-22 ENCOUNTER — Observation Stay (HOSPITAL_COMMUNITY)
Admission: RE | Admit: 2010-03-22 | Discharge: 2010-03-24 | Disposition: A | Discharge status: Home / Self Care | Payer: Medicare Other | Source: Ambulatory Visit | Attending: Internal Medicine | Admitting: Internal Medicine

## 2010-03-22 ENCOUNTER — Ambulatory Visit (HOSPITAL_COMMUNITY): Payer: Medicare Other

## 2010-03-22 DIAGNOSIS — J449 Chronic obstructive pulmonary disease, unspecified: Secondary | ICD-10-CM | POA: Insufficient documentation

## 2010-03-22 DIAGNOSIS — I4891 Unspecified atrial fibrillation: Secondary | ICD-10-CM

## 2010-03-22 DIAGNOSIS — I059 Rheumatic mitral valve disease, unspecified: Secondary | ICD-10-CM | POA: Insufficient documentation

## 2010-03-22 DIAGNOSIS — J4489 Other specified chronic obstructive pulmonary disease: Secondary | ICD-10-CM | POA: Insufficient documentation

## 2010-03-22 DIAGNOSIS — I519 Heart disease, unspecified: Secondary | ICD-10-CM | POA: Insufficient documentation

## 2010-03-22 DIAGNOSIS — I10 Essential (primary) hypertension: Secondary | ICD-10-CM | POA: Insufficient documentation

## 2010-03-22 DIAGNOSIS — I498 Other specified cardiac arrhythmias: Principal | ICD-10-CM | POA: Insufficient documentation

## 2010-03-22 DIAGNOSIS — F341 Dysthymic disorder: Secondary | ICD-10-CM | POA: Insufficient documentation

## 2010-03-22 LAB — BASIC METABOLIC PANEL
CO2: 29 mEq/L (ref 19–32)
Chloride: 105 mEq/L (ref 96–112)
Creatinine, Ser: 0.84 mg/dL (ref 0.4–1.2)
GFR calc Af Amer: >60 mL/min (ref >60)
Potassium: 5 mEq/L (ref 3.5–5.1)
Sodium: 140 mEq/L (ref 135–145)

## 2010-03-22 LAB — CBC
Hemoglobin: 16.1 g/dL — ABNORMAL HIGH (ref 12.0–15.0)
MCH: 33.3 pg (ref 26.0–34.0)
Platelets: 172 10*3/uL (ref 150–400)
RBC: 4.84 MIL/uL (ref 3.87–5.11)
WBC: 5.5 10*3/uL (ref 4.0–10.5)

## 2010-03-22 LAB — PROTIME-INR: Prothrombin Time: 20.7 seconds — ABNORMAL HIGH (ref 11.6–15.2)

## 2010-03-22 LAB — HEPARIN LEVEL (UNFRACTIONATED): Heparin Unfractionated: 0.53 IU/mL (ref 0.30–0.70)

## 2010-03-23 DIAGNOSIS — I498 Other specified cardiac arrhythmias: Secondary | ICD-10-CM

## 2010-03-23 LAB — CBC
HCT: 40.9 % (ref 36.0–46.0)
Hemoglobin: 13.9 g/dL (ref 12.0–15.0)
MCV: 93.6 fL (ref 78.0–100.0)
RBC: 4.37 MIL/uL (ref 3.87–5.11)
WBC: 4.5 10*3/uL (ref 4.0–10.5)

## 2010-03-23 LAB — PROTIME-INR
INR: 2.72 — ABNORMAL HIGH (ref 0.00–1.49)
Prothrombin Time: 28.9 seconds — ABNORMAL HIGH (ref 11.6–15.2)

## 2010-03-24 ENCOUNTER — Observation Stay (HOSPITAL_COMMUNITY): Payer: Medicare Other

## 2010-03-24 LAB — PROTIME-INR
INR: 2.93 — ABNORMAL HIGH (ref 0.00–1.49)
Prothrombin Time: 30.6 seconds — ABNORMAL HIGH (ref 11.6–15.2)

## 2010-03-24 LAB — CBC
HCT: 44.8 % (ref 36.0–46.0)
Hemoglobin: 15.3 g/dL — ABNORMAL HIGH (ref 12.0–15.0)
MCHC: 34.2 g/dL (ref 30.0–36.0)
RBC: 4.79 MIL/uL (ref 3.87–5.11)
WBC: 5.9 10*3/uL (ref 4.0–10.5)

## 2010-03-29 ENCOUNTER — Encounter: Payer: Self-pay | Admitting: Internal Medicine

## 2010-03-29 ENCOUNTER — Encounter (INDEPENDENT_AMBULATORY_CARE_PROVIDER_SITE_OTHER): Payer: Medicare Other

## 2010-03-29 ENCOUNTER — Other Ambulatory Visit (INDEPENDENT_AMBULATORY_CARE_PROVIDER_SITE_OTHER): Payer: Medicare Other

## 2010-03-29 ENCOUNTER — Other Ambulatory Visit: Payer: Self-pay | Admitting: Internal Medicine

## 2010-03-29 ENCOUNTER — Ambulatory Visit (INDEPENDENT_AMBULATORY_CARE_PROVIDER_SITE_OTHER): Payer: Medicare Other

## 2010-03-29 DIAGNOSIS — I10 Essential (primary) hypertension: Secondary | ICD-10-CM

## 2010-03-29 DIAGNOSIS — Z7901 Long term (current) use of anticoagulants: Secondary | ICD-10-CM

## 2010-03-29 DIAGNOSIS — I4891 Unspecified atrial fibrillation: Secondary | ICD-10-CM

## 2010-03-29 DIAGNOSIS — I495 Sick sinus syndrome: Secondary | ICD-10-CM

## 2010-03-29 LAB — CONVERTED CEMR LAB: POC INR: 4.5

## 2010-03-29 LAB — BASIC METABOLIC PANEL
CO2: 27 mEq/L (ref 19–32)
Calcium: 9.5 mg/dL (ref 8.4–10.5)
Chloride: 106 mEq/L (ref 96–112)
Creatinine, Ser: 0.7 mg/dL (ref 0.4–1.2)
Sodium: 140 mEq/L (ref 135–145)

## 2010-03-29 NOTE — Op Note (Signed)
NAMEKENSINGTON, [name]             ACCOUNT NO.:  [id number]  MEDICAL RECORD NO.:  [id number]           PATIENT TYPE:  I  LOCATION:  2506                         FACILITY:  [facility]  PHYSICIAN:  [name]. Rachael Ridgel, MD    DATE OF BIRTH:  1923-04-04  DATE OF PROCEDURE:  03/23/2010 DATE OF DISCHARGE:                              OPERATIVE REPORT   PROCEDURE PERFORMED:  Insertion of a dual-chamber pacemaker.  INDICATIONS:  Symptomatic bradycardia.  INTRODUCTION:  The patient is an 77 year old woman with a history of atrial fibrillation who underwent DC cardioversion, 1 day ago. Postprocedure, the patient had profound bradycardia with heart rate initially in the 20s.  She was treated with Isuprel and heart rates increased up into the low 40s on Isoproterenol.  Because of severe bradycardia, she is now referred for permanent pacemaker insertion.  PROCEDURE:  After informed consent was obtained, the patient was taken to the diagnostic EP lab in a fasting state.  After usual preparation and draping, intravenous fentanyl and midazolam was given for sedation. A 30 mL lidocaine was infiltrated into the left infraclavicular region. A 5-cm incision was carried out over this region.  Electrocautery was utilized to dissect down to the fascial plane.  The left subclavian vein was then punctured x2.  A hemostatic sheath was placed initially in the vein and the venous pressure was measured.  The mean pressure was approximately 18 mmHg.  Having accomplished this, the St. Jude model 2088T 52-cm active fixation pacing lead serial #[id number] was advanced into the right ventricle and the St. Jude model 2088T 46-cm active fixation pacing lead serial #[id number] was advanced into the right atrium.  Mapping was carried out in the right ventricle and the final site.  The R-waves measured 14 mV the pacing impedance with the lead actively fixed was 950 ohms and threshold was less than a volt of 0.5 milliseconds.   A 10-volt pacing did not stimulate the diaphragm.  With the ventricular lead in satisfactory position, attention was then turned to the placement of the atrial leads, placed in the anterolateral portion of the right atrium, where P-waves measured 2 mV, the pacing threshold was around a volt of 0.4 milliseconds and the impedance was 439 ohms.  Again, 10-volt pacing did not stimulate the diaphragm and again a large injury current was present with active fixation of the lead.  With both the atrial and ventricular leads in satisfactory position, they were secured to the subpectoralis fascia with a figure-of- eight silk suture.  The sewing sleeve was also secured with silk suture. Electrocautery was then utilized to make a subcutaneous pocket. Bacitracin irrigation was utilized to irrigate the pocket. Electrocautery was utilized to assure hemostasis.  The St. Jude Accent DR RF dual-chamber pacemaker serial [id number] was connected to the atrial and ventricular leads and placed back in the subcutaneous pocket where we secured with silk suture.  The pocket was irrigated with additional gentamicin.  The incision was closed with 2-0 and 3-0 Vicryl.  Benzoin, Steri-Strips were painted on the skin.  A pressure dressing was applied and the patient was returned to her room in  satisfactory condition.  COMPLICATIONS:  There were no immediate procedure complications.  RESULTS:  Demonstrate successful implantation of a St. Jude dual-chamber pacemaker in a patient with symptomatic bradycardia.     [name]. Rachael Ridgel, MD     GWT/MEDQ  D:  03/23/2010  T:  03/24/2010  Job:  [id number]  cc:   Duke Salvia, MD, [facility]  Electronically Signed by Lewayne Bunting MD on 03/29/2010 05:45:01 PM

## 2010-04-02 DIAGNOSIS — I4891 Unspecified atrial fibrillation: Secondary | ICD-10-CM

## 2010-04-04 ENCOUNTER — Encounter (INDEPENDENT_AMBULATORY_CARE_PROVIDER_SITE_OTHER): Payer: Medicare Other

## 2010-04-04 ENCOUNTER — Encounter: Payer: Self-pay | Admitting: Internal Medicine

## 2010-04-04 DIAGNOSIS — I498 Other specified cardiac arrhythmias: Secondary | ICD-10-CM

## 2010-04-04 NOTE — Medication Information (Signed)
Summary: rov/sp  Anticoagulant Therapy  Managed by: Bethena Midget, RN, BSN Referring MD: Excell Seltzer PCP: Corwin Levins MD Supervising MD: Tenny Craw MD, Gunnar Fusi Indication 1: Atrial Fibrillation Indication 2: Mitral Valve Disorder  Lab Used: [facility] [place] Site: [address] INR POC 4.5 INR RANGE 2.0-3.0  Dietary changes: no    Health status changes: no    Bleeding/hemorrhagic complications: no    Recent/future hospitalizations: yes       Details: Discharged on 03/24/10 s/p Pacer insertion .   Any changes in medication regimen? yes       Details: Amiodarone decreased from QID to BID  Recent/future dental: no  Any missed doses?: no       Is patient compliant with meds? yes      Comments: Since discharge pt has been taking 1 pill Daily except 1/2 pill on MWF.  Allergies: 1)  ! Codeine 2)  ! * Propafenone Hcl  Anticoagulation Management History:      The patient is taking warfarin and comes in today for a routine follow up visit.  Positive risk factors for bleeding include an age of 16 years or older.  The bleeding index is 'intermediate risk'.  Positive CHADS2 values include History of CHF and Age > 54 years old.  Her last INR was 6.1 ratio.  Anticoagulation responsible provider: Tenny Craw MD, Gunnar Fusi.  INR POC: 4.5.  Cuvette Lot#: [id number].  Exp: 02/2011.    Anticoagulation Management Assessment/Plan:      The patient's current anticoagulation dose is Warfarin sodium 5 mg tabs: Use as directed by Anticoagulation Clinic.  The target INR is 2.0-3.0.  The next INR is due 04/09/2010.  Anticoagulation instructions were given to patient.  Results were reviewed/authorized by Bethena Midget, RN, BSN.  She was notified by Bethena Midget, RN, BSN.         Prior Anticoagulation Instructions: INR 5.4  Skip Friday and Saturday's dose of Coumadin then decrease dsoe to 1/2 tablet every day except 1 tablet on Monday and Friday.  Recheck INR in 2 weeks.   Current Anticoagulation  Instructions: INR 4.5 Skip Friday and Saturdays dose then change dose to 1/2 pill everyday except 1 pill on Tuesdays and Thursdays. Recheck in 10 days.

## 2010-04-05 ENCOUNTER — Telehealth (INDEPENDENT_AMBULATORY_CARE_PROVIDER_SITE_OTHER): Payer: Self-pay | Admitting: *Deleted

## 2010-04-06 ENCOUNTER — Encounter: Payer: Self-pay | Admitting: Internal Medicine

## 2010-04-06 ENCOUNTER — Other Ambulatory Visit: Payer: Medicare Other

## 2010-04-06 DIAGNOSIS — I4891 Unspecified atrial fibrillation: Secondary | ICD-10-CM

## 2010-04-09 ENCOUNTER — Other Ambulatory Visit: Payer: Self-pay | Admitting: Internal Medicine

## 2010-04-09 ENCOUNTER — Encounter (INDEPENDENT_AMBULATORY_CARE_PROVIDER_SITE_OTHER): Payer: Medicare Other

## 2010-04-09 ENCOUNTER — Encounter: Payer: Self-pay | Admitting: Internal Medicine

## 2010-04-09 ENCOUNTER — Encounter: Payer: Self-pay | Admitting: Cardiology

## 2010-04-09 ENCOUNTER — Other Ambulatory Visit (INDEPENDENT_AMBULATORY_CARE_PROVIDER_SITE_OTHER): Payer: Medicare Other

## 2010-04-09 DIAGNOSIS — I10 Essential (primary) hypertension: Secondary | ICD-10-CM

## 2010-04-09 DIAGNOSIS — Z7901 Long term (current) use of anticoagulants: Secondary | ICD-10-CM

## 2010-04-09 DIAGNOSIS — I4891 Unspecified atrial fibrillation: Secondary | ICD-10-CM

## 2010-04-10 ENCOUNTER — Telehealth (INDEPENDENT_AMBULATORY_CARE_PROVIDER_SITE_OTHER): Payer: Self-pay | Admitting: *Deleted

## 2010-04-10 LAB — BASIC METABOLIC PANEL
CO2: 27 mEq/L (ref 19–32)
Calcium: 9.4 mg/dL (ref 8.4–10.5)
GFR: 69.53 mL/min (ref >60.00)
Glucose, Bld: 101 mg/dL — ABNORMAL HIGH (ref 70–99)
Potassium: 4.2 mEq/L (ref 3.5–5.1)
Sodium: 137 mEq/L (ref 135–145)

## 2010-04-10 NOTE — Cardiovascular Report (Signed)
Summary: Office Visit   Office Visit   Imported By: Roderic Ovens 04/06/2010 09:05:44  _____________________________________________________________________  External Attachment:    Type:   Image     Comment:   External Document

## 2010-04-10 NOTE — Progress Notes (Signed)
Summary: question re deivce kit  Phone Note Call from Patient Call back at [facility] Phone [phone]   Caller: Patient Reason for Call: Talk to Nurse Summary of Call: pt states she got a kit yesterday for her device. pt states she does not know what she needs to do with it. Initial call taken by: Roe Coombs,  April 05, 2010 10:36 AM  Follow-up for Phone Call        lmom for patient to bring transmitter with her to her next appt. and we will show her how to use it. Follow-up by: Altha Harm, LPN,  April 05, 2010 4:56 PM

## 2010-04-13 ENCOUNTER — Telehealth: Payer: Self-pay | Admitting: Internal Medicine

## 2010-04-19 ENCOUNTER — Encounter: Payer: Self-pay | Admitting: Internal Medicine

## 2010-04-19 ENCOUNTER — Encounter (INDEPENDENT_AMBULATORY_CARE_PROVIDER_SITE_OTHER): Payer: Medicare Other

## 2010-04-19 DIAGNOSIS — I059 Rheumatic mitral valve disease, unspecified: Secondary | ICD-10-CM

## 2010-04-19 DIAGNOSIS — I4891 Unspecified atrial fibrillation: Secondary | ICD-10-CM

## 2010-04-19 LAB — CONVERTED CEMR LAB: POC INR: 3.1

## 2010-04-19 NOTE — Procedures (Signed)
Summary: 1 week pacer clinic wound check//mt   Current Medications (verified): 1)  Clonazepam 1 Mg Tabs (Clonazepam) .... As Needed 2)  Warfarin Sodium 5 Mg Tabs (Warfarin Sodium) .... Use As Directed By Anticoagulation Clinic 3)  Vitamin B-12 1000 Mcg Tabs (Cyanocobalamin) .... Take 1 Tablet By Mouth Once A Day 4)  Spiriva Handihaler 18 Mcg Caps (Tiotropium Bromide Monohydrate) .... Uad 5)  Ambien 10 Mg Tabs (Zolpidem Tartrate) .Marland Kitchen.. 1 By Mouth At Bedtime As Needed 6)  Timolol Maleate 0.5 % Soln (Timolol Maleate) .Marland Kitchen.. 1 Drop in Both Eyes Every Morning and Evening 7)  Klor-Con M10 10 Meq Cr-Tabs (Potassium Chloride Crys Cr) .... As Directed 8)  Paxil 30 Mg Tabs (Paroxetine Hcl) .... Once Daily 9)  First-Marys Mouthwash  Susp (Diphenhyd-Hc-Nystatin-Tetracyc) .... Once Daily 10)  Besivance 0.6 % Susp (Besifloxacin Hcl) .... Use As Directed 11)  Amiodarone Hcl 400 Mg Tabs (Amiodarone Hcl) .... Two Times A Day  Allergies (verified): 1)  ! Codeine 2)  ! * Propafenone Hcl  Tech Comments:  see paceart report. Vella Kohler  April 04, 2010 8:39 AM

## 2010-04-19 NOTE — Progress Notes (Signed)
Summary: pt has medication question  Phone Note Call from Patient Call back at Home Phone [phone]   Caller: Patient Reason for Call: Talk to Nurse, Talk to Doctor Summary of Call: pt has a Rx for ramipril and it has Dr. Harvie Bridge name on it and she is questioning it cause she has never seen him before Initial call taken by: Omer Jack,  April 13, 2010 9:21 AM  Follow-up for Phone Call        Pt. wanted to verify that is okay to take Ramipril medication. Dr. Elease Hashimoto was the MD in the hospital and sign the prescription. Pt. verbalized understanding. Follow-up by: Ollen Gross, RN, BSN,  April 13, 2010 9:34 AM

## 2010-04-19 NOTE — Progress Notes (Signed)
Summary: Discomfort from pacemaker pt request call  Phone Note Call from Patient Call back at Home Phone [phone]   Caller: Patient Summary of Call: Pt having discomfort from pacemaker, little pain Initial call taken by: Judie Grieve,  April 10, 2010 9:38 AM  Follow-up for Phone Call        per pt calling back . pacermaker was place 2 week ago. c/o shaking, [phone] Lorne Skeens  April 11, 2010 12:24 PM   WILL FORWARD TO Ellie Bryand ODELL Scherrie Bateman, LPN  April 11, 2010 12:30 PM   Additional Follow-up for Phone Call Additional follow up Details #1::        Spoke with patient. She is c/o tremors in both hands, no pain or swelling at the pacer site or to the affected arm.  I tried to reassure her that it was not due to the pacemaker.  She has an appointment 3/1 with her PCP and will call us as necessary Additional Follow-up by: Altha Harm, LPN,  April 11, 2010 12:35 PM

## 2010-04-19 NOTE — Miscellaneous (Signed)
Summary: Orders Update  Clinical Lists Changes  Orders: Added new Test order of TLB-BMP (Basic Metabolic Panel-BMET) (80048-METABOL) - Signed 

## 2010-04-19 NOTE — Medication Information (Signed)
Summary: rov/tp  Anticoagulant Therapy  Managed by: Geoffry Paradise, PharmD Referring MD: Excell Seltzer PCP: Corwin Levins MD Supervising MD: Jens Som MD, Arlys John Indication 1: Atrial Fibrillation Indication 2: Mitral Valve Disorder  Lab Used: [facility] [place] Site: [address] INR POC 1.4 INR RANGE 2.0-3.0  Dietary changes: no    Health status changes: no    Bleeding/hemorrhagic complications: no    Recent/future hospitalizations: no    Any changes in medication regimen? no    Recent/future dental: no  Any missed doses?: yes     Details: Patient unaware she needed to take Coumadin on Saturday and Sunday  Is patient compliant with meds? yes       Allergies: 1)  ! Codeine 2)  ! * Propafenone Hcl  Anticoagulation Management History:      The patient is taking warfarin and comes in today for a routine follow up visit.  Positive risk factors for bleeding include an age of 75 years or older.  The bleeding index is 'intermediate risk'.  Positive CHADS2 values include History of CHF, History of HTN, and Age > 45 years old.  Her last INR was 6.1 ratio.  Anticoagulation responsible provider: Jens Som MD, Arlys John.  INR POC: 1.4.  Cuvette Lot#: [id number].  Exp: 02/2011.    Anticoagulation Management Assessment/Plan:      The patient's current anticoagulation dose is Warfarin sodium 5 mg tabs: Use as directed by Anticoagulation Clinic.  The target INR is 2.0-3.0.  The next INR is due 04/12/2010.  Anticoagulation instructions were given to patient.  Results were reviewed/authorized by Geoffry Paradise, PharmD.         Prior Anticoagulation Instructions: INR 4.5 Skip Friday and Saturdays dose then change dose to 1/2 pill everyday except 1 pill on Tuesdays and Thursdays. Recheck in 10 days.    Current Anticoagulation Instructions: INR: 1.4 (goal 2-3)   Your INR is low today since you missed Saturday and Sunday's coumadin.  For tonight, take 1 tablet today then resume your schedule  of 1/2 a tablet everyday EXCEPT 1 tablet on Tuesday and Thursday.  Return for another INR check in 10 days.

## 2010-04-24 NOTE — Medication Information (Signed)
Summary: rov/ewj pt rs appt /mt  Anticoagulant Therapy  Managed by: Bethena Midget, RN, BSN Referring MD: Excell Seltzer PCP: Corwin Levins MD Supervising MD: Ladona Ridgel MD, Sharlot Gowda Indication 1: Atrial Fibrillation Indication 2: Mitral Valve Disorder  Lab Used: [facility] [place] Site: [address] INR POC 3.1 INR RANGE 2.0-3.0  Dietary changes: no    Health status changes: no    Bleeding/hemorrhagic complications: no    Recent/future hospitalizations: no    Any changes in medication regimen? no    Recent/future dental: no  Any missed doses?: no       Is patient compliant with meds? yes       Allergies: 1)  ! Codeine 2)  ! * Propafenone Hcl  Anticoagulation Management History:      The patient is taking warfarin and comes in today for a routine follow up visit.  Positive risk factors for bleeding include an age of 32 years or older.  The bleeding index is 'intermediate risk'.  Positive CHADS2 values include History of CHF, History of HTN, and Age > 23 years old.  Her last INR was 6.1 ratio.  Anticoagulation responsible provider: Ladona Ridgel MD, Sharlot Gowda.  INR POC: 3.1.  Cuvette Lot#: [id number].  Exp: 02/2011.    Anticoagulation Management Assessment/Plan:      The patient's current anticoagulation dose is Warfarin sodium 5 mg tabs: Use as directed by Anticoagulation Clinic.  The target INR is 2.0-3.0.  The next INR is due 05/03/2010.  Anticoagulation instructions were given to patient.  Results were reviewed/authorized by Bethena Midget, RN, BSN.  She was notified by Bethena Midget, RN, BSN.         Prior Anticoagulation Instructions: INR: 1.4 (goal 2-3)   Your INR is low today since you missed Saturday and Sunday's coumadin.  For tonight, take 1 tablet today then resume your schedule of 1/2 a tablet everyday EXCEPT 1 tablet on Tuesday and Thursday.  Return for another INR check in 10 days.    Current Anticoagulation Instructions: INR 3.1 Change dose to 1/2 tablet daily except 1  tablet on Thursdays. Recheck in 2 weeks.

## 2010-04-27 LAB — PROTIME-INR
INR: 1.85 — ABNORMAL HIGH (ref 0.00–1.49)
Prothrombin Time: 21.5 seconds — ABNORMAL HIGH (ref 11.6–15.2)

## 2010-04-27 LAB — POCT I-STAT 4, (NA,K, GLUC, HGB,HCT): Hemoglobin: 16.3 g/dL — ABNORMAL HIGH (ref 12.0–15.0)

## 2010-04-29 LAB — CBC
HCT: 42.1 % (ref 36.0–46.0)
Hemoglobin: 14.6 g/dL (ref 12.0–15.0)
MCH: 33.6 pg (ref 26.0–34.0)
MCV: 97 fL (ref 78.0–100.0)
RBC: 4.34 MIL/uL (ref 3.87–5.11)
WBC: 6.3 10*3/uL (ref 4.0–10.5)

## 2010-04-29 LAB — BASIC METABOLIC PANEL
CO2: 24 mEq/L (ref 19–32)
Chloride: 106 mEq/L (ref 96–112)
GFR calc Af Amer: >60 mL/min (ref >60)
Potassium: 3.9 mEq/L (ref 3.5–5.1)

## 2010-04-29 LAB — APTT: aPTT: 46 seconds — ABNORMAL HIGH (ref 24–37)

## 2010-05-01 NOTE — Procedures (Signed)
Summary: pacer ck/rs missed appt 2-16/having pain/swelling/mt   Current Medications (verified): 1)  Clonazepam 1 Mg Tabs (Clonazepam) .... As Needed 2)  Warfarin Sodium 5 Mg Tabs (Warfarin Sodium) .... Use As Directed By Anticoagulation Clinic 3)  Vitamin B-12 1000 Mcg Tabs (Cyanocobalamin) .... Take 1 Tablet By Mouth Once A Day 4)  Spiriva Handihaler 18 Mcg Caps (Tiotropium Bromide Monohydrate) .... Uad 5)  Ambien 10 Mg Tabs (Zolpidem Tartrate) .Marland Kitchen.. 1 By Mouth At Bedtime As Needed 6)  Timolol Maleate 0.5 % Soln (Timolol Maleate) .Marland Kitchen.. 1 Drop in Both Eyes Every Morning and Evening 7)  Klor-Con M10 10 Meq Cr-Tabs (Potassium Chloride Crys Cr) .... As Directed 8)  Paxil 30 Mg Tabs (Paroxetine Hcl) .... Once Daily 9)  First-Marys Mouthwash  Susp (Diphenhyd-Hc-Nystatin-Tetracyc) .... Once Daily 10)  Besivance 0.6 % Susp (Besifloxacin Hcl) .... Use As Directed 11)  Amiodarone Hcl 400 Mg Tabs (Amiodarone Hcl) .... Two Times A Day  Allergies (verified): 1)  ! Codeine 2)  ! * Propafenone Hcl

## 2010-05-03 ENCOUNTER — Ambulatory Visit (INDEPENDENT_AMBULATORY_CARE_PROVIDER_SITE_OTHER): Payer: Medicare Other | Admitting: *Deleted

## 2010-05-03 DIAGNOSIS — Z7901 Long term (current) use of anticoagulants: Secondary | ICD-10-CM

## 2010-05-03 DIAGNOSIS — I4891 Unspecified atrial fibrillation: Secondary | ICD-10-CM

## 2010-05-03 LAB — POCT INR: INR: 3.6

## 2010-05-03 NOTE — Discharge Summary (Signed)
[name], [name]             ACCOUNT NO.:  [id number]  MEDICAL RECORD NO.:  [id number]           PATIENT TYPE:  I  LOCATION:  2506                         FACILITY:  [facility]  PHYSICIAN:  Duke Salvia, MD, FACCDATE OF BIRTH:  02-16-1923  DATE OF ADMISSION:  03/22/2010 DATE OF DISCHARGE:  03/24/2010                              DISCHARGE SUMMARY   CARDIOLOGIST:  Duke Salvia, MD, [facility]  REASON FOR ADMISSION:  Cardioversion for atrial fibrillation/flutter.  DISCHARGE DIAGNOSES: 1. Atrial fibrillation/flutter.     a.     Status post TEE-guided cardioversion secondary to      subtherapeutic INR. 2. 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     a.     Status post pacemaker implantation this admission. 3. Hypertension.     a.     ACE inhibitor added to medications this admission. 4. Amiodarone therapy. 5. History of mitral valve repair at [facility]. 6. Depression/anxiety. 7. Chronic obstructive pulmonary disease. 8. Glaucoma. 9. History of anemia. 10.Left ventricular dysfunction with an ejection fraction of 30-35%     and moderate mitral regurgitation.  PROCEDURES PERFORMED THIS ADMISSION:  Implantation of a [employer] pacemaker programmed at [facility] by Dr. Lewayne Bunting, March 23, 2010.  ADMISSION HISTORY:  [name] had been evaluated recently in the office with atrial fibrillation/flutter.  She was symptomatic with this. Planned cardioversion was arranged.  She was brought in on March 22, 2010.  Her INR was subtherapeutic and a transesophageal echocardiogram demonstrated no left atrial appendage thrombus.  Therefore, cardioversion was planned.  HOSPITAL COURSE:  The patient underwent cardioversion.  Dr. Graciela Husbands spoke with the Coumadin Clinic and changed her Coumadin to 5 mg Tuesday, Thursday, Saturday, Sunday and 2.5 mg all other days being (Monday, Wednesday, Friday).  After her cardioversion, she developed junctional bradycardia with heart  rates in the 30s.  She was treated with atropine without change.  She received Isuprel and developed a junctional rhythm. The patient continued to have a junctional rhythm and was eventually taken for permanent pacemaker implantation.  This was performed by Dr. Ladona Ridgel on March 23, 2010.  She tolerated the procedure well, had no immediate complications.  On the morning of August 22, 2010, she was found to be in stable condition and ready for discharge to home.  Her postprocedure chest x-ray demonstrated no evidence of pneumothorax.  It was recommended that she remain on amiodarone 200 mg b.i.d. until she seen back in followup by Dr. Graciela Husbands.  She will need a basic metabolic panel within a week given the addition of an ACE inhibitor to her medical regimen this admission.  She will also need close followup on her INR.  DISCHARGE MEDICATIONS: 1. Amiodarone 200 mg b.i.d. 2. Paroxetine 30 mg daily. 3. Ramipril 2.5 mg daily. 4. Ambien 10 mg nightly p.r.n. 5. Brimonidine 0.15% ophthalmic solution one drop both eyes twice aday. 6. Clonazepam 1 mg daily as needed. 7. Coumadin 2.5 mg on Monday, Wednesday, Friday and 5 mg on Tuesday,     Thursday, Saturday, Sunday. 8. Durezol 0.05% in the right eye three times a day. 9. Multivitamins daily. 10.Nevanac  0.1% one drop in the right eye three times a day. 11.Patanase 0.6% one spray nasally twice daily. 12.Spiriva one puff daily. 13.Timolol ophthalmic solution 0.5% one drop both eyes twice daily. 14.Vitamin B12 1000 mcg daily.  ALLERGIES:  CODEINE.  DIET:  Low-fat, low-sodium diet.  ACTIVITY:  She is to increase her activity slowly.  She has been provided with written instructions regarding activities with her left upper extremity over the next couple of weeks and signs and symptoms of infection.  FOLLOWUP: 1. Pacemaker Clinic in the next week for wound check. 2. Dr. Graciela Husbands within the next 3 months. 3. Coumadin Clinic within the next week. 4.  Lab appointment for basic metabolic panel within the next week.  The patient will be contacted for the above appointments.  TOTAL PHYSICIAN AND PA TIME:  Greater than 30 minutes since discharge.     Tereso Newcomer, PA-C   ______________________________ Duke Salvia, MD, [facility]    SW/MEDQ  D:  03/24/2010  T:  03/25/2010  Job:  [id number]  Electronically Signed by Tereso Newcomer PA-C on 04/09/2010 08:32:05 AM Electronically Signed by Sherryl Manges MD FACC on 05/03/2010 08:31:58 AM

## 2010-05-03 NOTE — Patient Instructions (Signed)
INR 3.6  Hold coumadin tomorrow (Friday) Start from Saturday, take 1/2 tablet (2.5mg ) every day, except for Thursday take 1 tablet Recheck INR in 2 weeks

## 2010-05-17 ENCOUNTER — Ambulatory Visit (INDEPENDENT_AMBULATORY_CARE_PROVIDER_SITE_OTHER): Payer: Medicare Other | Admitting: *Deleted

## 2010-05-17 DIAGNOSIS — Z7901 Long term (current) use of anticoagulants: Secondary | ICD-10-CM

## 2010-05-17 DIAGNOSIS — I4891 Unspecified atrial fibrillation: Secondary | ICD-10-CM

## 2010-05-17 LAB — POCT INR: INR: 2.4

## 2010-06-06 NOTE — Discharge Summary (Signed)
  [name], [name]             ACCOUNT NO.:  [id number]  MEDICAL RECORD NO.:  [id number]           PATIENT TYPE:  I  LOCATION:  2506                         FACILITY:  [facility]  PHYSICIAN:  Duke Salvia, MD, FACCDATE OF BIRTH:  [birth year]  DATE OF ADMISSION:  03/22/2010 DATE OF DISCHARGE:  03/24/2010                              DISCHARGE SUMMARY   ADDENDUM:  LABORATORY DATA:  Hemoglobin 15.3.  INR 2.93 at discharge.  Potassium 5. Creatinine 0.84.  Chest x-ray on the discharge, left chest dual lead cardiac pacemaker with no adverse features, left lung atelectasis.     Tereso Newcomer, PA-C   ______________________________ Duke Salvia, MD, [facility]    SW/MEDQ  D:  03/24/2010  T:  03/25/2010  Job:  [id number]  cc:   Duke Salvia, MD, [facility]  Electronically Signed by Tereso Newcomer PA-C on 05/06/2010 12:17:54 PM Electronically Signed by Sherryl Manges MD [facility] on 06/06/2010 08:46:50 AM

## 2010-06-15 ENCOUNTER — Encounter: Payer: Self-pay | Admitting: Internal Medicine

## 2010-06-19 ENCOUNTER — Ambulatory Visit (INDEPENDENT_AMBULATORY_CARE_PROVIDER_SITE_OTHER): Payer: Medicare Other | Admitting: Internal Medicine

## 2010-06-19 ENCOUNTER — Ambulatory Visit (INDEPENDENT_AMBULATORY_CARE_PROVIDER_SITE_OTHER): Payer: Medicare Other | Admitting: *Deleted

## 2010-06-19 ENCOUNTER — Encounter: Payer: Self-pay | Admitting: Internal Medicine

## 2010-06-19 DIAGNOSIS — I4891 Unspecified atrial fibrillation: Secondary | ICD-10-CM

## 2010-06-19 DIAGNOSIS — Z950 Presence of cardiac pacemaker: Secondary | ICD-10-CM

## 2010-06-19 DIAGNOSIS — I10 Essential (primary) hypertension: Secondary | ICD-10-CM

## 2010-06-19 DIAGNOSIS — I498 Other specified cardiac arrhythmias: Secondary | ICD-10-CM

## 2010-06-19 DIAGNOSIS — F341 Dysthymic disorder: Secondary | ICD-10-CM

## 2010-06-19 DIAGNOSIS — I509 Heart failure, unspecified: Secondary | ICD-10-CM

## 2010-06-19 HISTORY — DX: Presence of cardiac pacemaker: Z95.0

## 2010-06-19 NOTE — Assessment & Plan Note (Signed)
Overlying a elevated. We'll continue her current Hyzaar

## 2010-06-19 NOTE — Patient Instructions (Signed)
Your physician wants you to follow-up in: 9 months.You will receive a reminder letter in the mail two months in advance. If you don't receive a letter, please call our office to schedule the follow-up appointment.  Your physician recommends that you continue on your current medications as directed. Please refer to the Current Medication list given to you today.

## 2010-06-19 NOTE — Assessment & Plan Note (Signed)
The patient's device was interrogated and the information was fully reviewed.  The device was reprogrammed to maximize longevity  

## 2010-06-19 NOTE — Assessment & Plan Note (Signed)
Stable on current medications; she is not taking a diuretic apart from her combination antihypertensive

## 2010-06-19 NOTE — Assessment & Plan Note (Signed)
This is a major concern for her. We will seek to get her name was in counseling opportunities. She will follow up with her PCP regarding pharmacological therapy

## 2010-06-19 NOTE — Progress Notes (Signed)
  HPI  [name] is a 77 y.o. female Seen in followup for atrial fibrillation requiring cardioversion and profound post cardioversion bradycardia requiring sympathomimetic support. This prompted pacemaker implantation February 2012.  From a arrhythmia point of view, she has done really very well since that time with only scant palpitations.  Her major issue is depression and anxiety. She is seeking counseling and pharmacological therapy.  Past Medical History  Diagnosis Date  . ABNORMAL ELECTROCARDIOGRAM   . ACID REFLUX DISEASE   . ANEMIA-NOS   . Atrial fibrillation   . BREAST CANCER   . CHF   . COPD   . DEPRESSION/ANXIETY   . DIVERTICULITIS, HX OF   . DYSGEUSIA   . Essential hypertension, benign   . FATIGUE   . GLAUCOMA   . Shortness of breath   . UNSPECIFIED TACHYCARDIA   . LYMPHEDEMA, RIGHT ARM     Past Surgical History  Procedure Date  . Mitral valve repair 2002  . Esophagogastrodenoscopy   . Partial colectomy   . Shoulder surgery   . Mastectomy     right breast     Current Outpatient Prescriptions  Medication Sig Dispense Refill  . clonazePAM (KLONOPIN) 1 MG tablet Take 1 mg by mouth as needed.        . mometasone (NASONEX) 50 MCG/ACT nasal spray 2 sprays by Nasal route daily.        . timolol (BETIMOL) 0.5 % ophthalmic solution 1 drop as directed.        . tiotropium (SPIRIVA) 18 MCG inhalation capsule Place 18 mcg into inhaler and inhale as directed.        . vitamin B-12 (CYANOCOBALAMIN) 1000 MCG tablet Take 1,000 mcg by mouth daily.        Marland Kitchen warfarin (COUMADIN) 5 MG tablet Take by mouth as directed.        . zolpidem (AMBIEN) 10 MG tablet Take 10 mg by mouth at bedtime as needed.        Marland Kitchen DISCONTD: amiodarone (PACERONE) 400 MG tablet Take 400 mg by mouth 2 (two) times daily.        Marland Kitchen DISCONTD: Besifloxacin HCl (BESIVANCE OP) Apply to eye as directed.        Marland Kitchen DISCONTD: PARoxetine (PAXIL) 30 MG tablet Take 30 mg by mouth daily.        Marland Kitchen DISCONTD:  potassium chloride (KLOR-CON) 10 MEQ CR tablet Take 10 mEq by mouth as directed.          Allergies  Allergen Reactions  . Codeine     REACTION: Nausea and cold sweats  . Propafenone Hcl     REACTION: Reaction not known    Review of Systems negative except from HPI and PMH  Physical Exam Well developed and well nourished older Caucasian female appearing her stated age in no acute distress HENT normal E scleral and icterus clear Neck Supple JVP flat; carotids brisk and full Clear to ausculation Regular rate and rhythm, no murmurs gallops or rub Soft with active bowel sounds No clubbing cyanosis and edema Alert and oriented, grossly normal motor and sensory function Skin Warm and Dry Affect is flat   Assessment and  Plan

## 2010-06-19 NOTE — Assessment & Plan Note (Signed)
Scant atrial fibrillation as detected by her device. Furthermore, it was not very fast. We will continue her on oral anticoagulation

## 2010-07-12 ENCOUNTER — Other Ambulatory Visit: Payer: Self-pay | Admitting: Internal Medicine

## 2010-07-13 ENCOUNTER — Telehealth: Payer: Self-pay | Admitting: Internal Medicine

## 2010-07-13 NOTE — Telephone Encounter (Signed)
Dr. Graciela Husbands was to suggest another doctor for her to regarding her mental status.  She has not heard anything regarding this and it has been a couple of weeks.  Please check this for her.

## 2010-07-17 ENCOUNTER — Ambulatory Visit (INDEPENDENT_AMBULATORY_CARE_PROVIDER_SITE_OTHER): Payer: Medicare Other | Admitting: *Deleted

## 2010-07-17 DIAGNOSIS — I4891 Unspecified atrial fibrillation: Secondary | ICD-10-CM

## 2010-07-17 LAB — POCT INR: INR: 1.7

## 2010-07-31 ENCOUNTER — Encounter: Payer: Medicare Other | Admitting: *Deleted

## 2010-08-01 ENCOUNTER — Ambulatory Visit (INDEPENDENT_AMBULATORY_CARE_PROVIDER_SITE_OTHER): Payer: Medicare Other | Admitting: *Deleted

## 2010-08-01 DIAGNOSIS — I4891 Unspecified atrial fibrillation: Secondary | ICD-10-CM

## 2010-08-01 LAB — POCT INR: INR: 1.6

## 2010-08-13 ENCOUNTER — Ambulatory Visit (INDEPENDENT_AMBULATORY_CARE_PROVIDER_SITE_OTHER): Payer: Medicare Other | Admitting: *Deleted

## 2010-08-13 DIAGNOSIS — I4891 Unspecified atrial fibrillation: Secondary | ICD-10-CM

## 2010-09-03 ENCOUNTER — Ambulatory Visit (INDEPENDENT_AMBULATORY_CARE_PROVIDER_SITE_OTHER): Payer: Medicare Other | Admitting: *Deleted

## 2010-09-03 DIAGNOSIS — I4891 Unspecified atrial fibrillation: Secondary | ICD-10-CM

## 2010-09-03 LAB — POCT INR: INR: 2

## 2010-09-18 ENCOUNTER — Telehealth: Payer: Self-pay | Admitting: Internal Medicine

## 2010-09-18 NOTE — Telephone Encounter (Signed)
I spoke with the patient. She states that she has had fast HR's for a least a week now. She is in [place] with her daughter. She does not have her transmitter with her for her device. She states that she went with her daughter to an office visit this morning and had the nurse check her pulse there and she was told it was about 130 bpm. No EKG was done. The patient is on coumadin, but is not on any rate controlling drugs. I have advised the patient to report to an Urgent Care or the local emergency room to have them evaluate her since knowing her actual rate and rhythm is important to know. She verbalizes understanding.

## 2010-09-18 NOTE — Telephone Encounter (Signed)
Per pt call, pt pulse rate is usually in the 70's and now it is in the 130's. Pt said she has a pacemaker. Pt said pulse rate has been increased for week.  Pt is currently in [place], [place] at the moment. Spoke w/ Sherri Rad who instructed me to take a message for a call to be returned. Please return pt call.

## 2010-09-19 ENCOUNTER — Telehealth: Payer: Self-pay | Admitting: *Deleted

## 2010-09-19 NOTE — Telephone Encounter (Signed)
The patient called back today regarding her call from yesterday. She states she was seen in the ER at [facility] in [place]. She had an EKG and was given IV meds for her heart rate. She states her rate did become controlled. She has cardiology f/u with a Dr. Antony Madura tomorrow in [place]. She will be there for another 2 weeks. I explained she should go ahead and keep that appointment. I explained if she could get her records from the ER, that would be helpful for when we see her back. She called the hospital and stated that the person she spoke with in medical records would release the records to [place] without a signed release if we would call. I left a message for Tamala at [phone] to call me.

## 2010-09-19 NOTE — Telephone Encounter (Signed)
I spoke with Rinaldo Cloud at [place]. [employer]. She will send the patient's records to [place].

## 2010-09-27 ENCOUNTER — Telehealth: Payer: Self-pay | Admitting: Internal Medicine

## 2010-09-27 NOTE — Telephone Encounter (Signed)
Returning call back to nurse.  

## 2010-09-27 NOTE — Telephone Encounter (Signed)
I spoke with the patient. She states that the day after she went to the ER in [place], which would have been 8/8, that she developed more rapid HR's. She went for cardiology f/u with Dr. Antony Madura in [facility] yesterday and had a HR of 130bpm. The patient states that they have not checked her PPM there. The patient reports she was started on Sotalol 80mg  one tablet BID yesterday. She is supposed to follow back up with Dr. Antony Madura tomorrow for an EKG. I asked that she ask them to check her PPM while she is there. She states she will do this. She wanted me to relay this information to Dr. Graciela Husbands. I have asked that she call me back when she returns from [facility] and let me know what has happened. She is agreeable with this. I will forward this to Dr. Graciela Husbands for his review.

## 2010-09-27 NOTE — Telephone Encounter (Signed)
I spoke with the patient. She is unable to speak at the moment. She will call me back in about 5 minutes.

## 2010-09-27 NOTE — Telephone Encounter (Signed)
Pt calling back requesting call asap

## 2010-09-27 NOTE — Telephone Encounter (Signed)
Per pt call, pt has been having excessive Heart Beating. Pt has already been to the hospital in [place] (pt is currently out of town at the moment). Pt wants to know if her pacemaker is working efficiently please return pt call to advise/discuss.

## 2010-10-04 ENCOUNTER — Telehealth: Payer: Self-pay | Admitting: Internal Medicine

## 2010-10-04 NOTE — Telephone Encounter (Signed)
LMTC

## 2010-10-04 NOTE — Telephone Encounter (Signed)
Pt in [place] and went to er due to heart problems, they shocked her heart this weekend and went for fu appt with a cardiologist there who said she was doing very well but needed to fu with klein when she came home, he is booked until October pt  [phone]

## 2010-10-05 NOTE — Telephone Encounter (Signed)
Pt aware that Herbert Seta is off. Returning call back

## 2010-10-08 NOTE — Telephone Encounter (Signed)
Pt wanted to speak w/ Herbert Seta. Pt said she was a little upset that Herbert Seta has not called back. Pt needs to get an appt w/ Dr. Graciela Husbands next week. Please return pt call to advise/discuss.

## 2010-10-08 NOTE — Telephone Encounter (Signed)
I talked with pt. Pt states she will be returning from [facility] this Friday August 31. She is requesting an appt with Dr Graciela Husbands next week. I will forward to Dr Graciela Husbands for review and  followup Please call pt at [phone].

## 2010-10-09 NOTE — Telephone Encounter (Signed)
I spoke with the patient. She will see Dr. Graciela Husbands on 10/16/10 @ 2:00pm.

## 2010-10-16 ENCOUNTER — Encounter: Payer: Self-pay | Admitting: Internal Medicine

## 2010-10-16 ENCOUNTER — Ambulatory Visit (INDEPENDENT_AMBULATORY_CARE_PROVIDER_SITE_OTHER): Payer: Medicare Other | Admitting: Internal Medicine

## 2010-10-16 ENCOUNTER — Ambulatory Visit (INDEPENDENT_AMBULATORY_CARE_PROVIDER_SITE_OTHER): Payer: Medicare Other | Admitting: *Deleted

## 2010-10-16 ENCOUNTER — Other Ambulatory Visit: Payer: Self-pay | Admitting: Cardiovascular Disease

## 2010-10-16 DIAGNOSIS — I4891 Unspecified atrial fibrillation: Secondary | ICD-10-CM

## 2010-10-16 DIAGNOSIS — F341 Dysthymic disorder: Secondary | ICD-10-CM

## 2010-10-16 DIAGNOSIS — Z950 Presence of cardiac pacemaker: Secondary | ICD-10-CM

## 2010-10-16 LAB — PACEMAKER DEVICE OBSERVATION
AL IMPEDENCE PM: 440 Ohm
ATRIAL PACING PM: 81
BAMS-0003: 70 {beats}/min
BATTERY VOLTAGE: 2.98 V
RV LEAD AMPLITUDE: 12 mv
RV LEAD IMPEDENCE PM: 790 Ohm

## 2010-10-16 NOTE — Patient Instructions (Addendum)
Before starting any new medications, please have the doctor prescribing these medications check them against the website at:   QTdrugs.org.  Your physician recommends that you have lab work today: bmet/magnesium (427.31)  Your physician wants you to follow-up in: February 2013. You will receive a reminder letter in the mail two months in advance. If you don't receive a letter, please call our office to schedule the follow-up appointment.  Your physician has recommended you make the following change in your medication:  1) Stop Celexa.

## 2010-10-16 NOTE — Progress Notes (Deleted)
  HPI  [name] is a 77 y.o. female Seen in followup for atrial fibrillation requiring cardioversion and profound post cardioversion bradycardia requiring sympathomimetic support. This prompted pacemaker implantation February 2012.  From a arrhythmia point of view, she had been doing well except for scant palpitations. She was in [place] last month and developed recurrent tachypalpitations. She had 2 episodes one of which converted spontaneously and the other which required DC cardioversion. She was treated with sotalol. It is noteworthy that she was on Celexa for depression.    Laboratories at that time demonstrated creatinine of 0.6 consistent with a GFR of greater than 60Her major issue is depression and anxiety. She is seeking counseling and pharmacological therapy.  Myoview 2011 demonstrated no ischemia Past Medical History  Diagnosis Date  . ABNORMAL ELECTROCARDIOGRAM   . ACID REFLUX DISEASE   . ANEMIA-NOS   . Atrial fibrillation   . BREAST CANCER   . CHF   . COPD   . DEPRESSION/ANXIETY   . DIVERTICULITIS, HX OF   . DYSGEUSIA   . Essential hypertension, benign   . FATIGUE   . GLAUCOMA   . Shortness of breath   . UNSPECIFIED TACHYCARDIA   . LYMPHEDEMA, RIGHT ARM     Past Surgical History  Procedure Date  . Mitral valve repair 2002  . Esophagogastrodenoscopy   . Partial colectomy   . Shoulder surgery   . Mastectomy     right breast     Current Outpatient Prescriptions  Medication Sig Dispense Refill  . citalopram (CELEXA) 20 MG tablet Take 20 mg by mouth as directed.        . clonazePAM (KLONOPIN) 1 MG tablet Take 1 mg by mouth as needed.        . mometasone (NASONEX) 50 MCG/ACT nasal spray 2 sprays by Nasal route daily.        . sotalol (BETAPACE) 80 MG tablet Take 80 mg by mouth 2 (two) times daily.        . timolol (BETIMOL) 0.5 % ophthalmic solution 1 drop as directed.        . tiotropium (SPIRIVA) 18 MCG inhalation capsule Place 18 mcg into inhaler and  inhale as directed.        . vitamin B-12 (CYANOCOBALAMIN) 1000 MCG tablet Take 1,000 mcg by mouth daily.        Marland Kitchen warfarin (COUMADIN) 5 MG tablet TAKE AS DIRECTED PER WARFARIN CLINIC***  30 tablet  3  . zolpidem (AMBIEN) 10 MG tablet Take 10 mg by mouth at bedtime as needed.          Allergies  Allergen Reactions  . Codeine     REACTION: Nausea and cold sweats  . Propafenone Hcl     REACTION: Reaction not known    Review of Systems negative except from HPI and PMH  Physical Exam Well developed and well nourished older Caucasian female appearing her stated age in no acute distress wearing sunglasses HENT normal E scleral and icterus clear Neck Supple JVP flat; carotids brisk and full Clear to ausculation Regular rate and rhythm, no murmurs gallops or rub Soft with active bowel sounds No clubbing cyanosis and edema Alert and oriented, grossly normal motor and sensory function Skin Warm and Dry Affect is flat   Assessment and  Plan

## 2010-10-16 NOTE — Assessment & Plan Note (Signed)
We have discussed the QT prolongation potential of Celexa and it is contraindicated in the context of sotalol. I gave her the option of changing her antiarrhythmic or stopping her Celexa. She chose the latter and will follow up with her primary care physician or psychiatrist for a change in therapy

## 2010-10-16 NOTE — Assessment & Plan Note (Signed)
The patient has had recurrent atrial fibrillation with a rapid response. She was started on sotalol. We have had a lengthy discussion regarding proarrhythmic risk of sotalol and the importance for checking all potential drugs against the QT drugs.org website. In addition we will check a potassium and magnesium today. Her ECG is pending

## 2010-10-16 NOTE — Assessment & Plan Note (Signed)
The patient's device was interrogated.  The information was reviewed. No changes were made in the programming.    

## 2010-10-17 LAB — BASIC METABOLIC PANEL
CO2: 26 mEq/L (ref 19–32)
Chloride: 104 mEq/L (ref 96–112)
Glucose, Bld: 84 mg/dL (ref 70–99)
Sodium: 140 mEq/L (ref 135–145)

## 2010-10-31 ENCOUNTER — Ambulatory Visit (INDEPENDENT_AMBULATORY_CARE_PROVIDER_SITE_OTHER): Payer: Medicare Other

## 2010-10-31 ENCOUNTER — Telehealth: Payer: Self-pay | Admitting: Internal Medicine

## 2010-10-31 VITALS — BP 112/60 | HR 112 | Wt 129.0 lb

## 2010-10-31 DIAGNOSIS — F341 Dysthymic disorder: Secondary | ICD-10-CM

## 2010-10-31 NOTE — Progress Notes (Signed)
Pt here for EKG. No complaints other than fatigue. Is taking all meds as on med list. EKG done and Dr. Graciela Husbands plans to review when in office tomorrow. Pt aware. EKG left on Dr.Klein's cart. She is aware to call or go to ED if heart rate increases or she starts having symptoms

## 2010-10-31 NOTE — Telephone Encounter (Signed)
Pt calling stating that pt pulse rate is 111. Pt pulse has been high since yesterday and pt would like to be advised as to what to do. Please return call to discuss further.

## 2010-10-31 NOTE — Telephone Encounter (Signed)
Spoke with pt who reports not feeling right when she got up yesterday. Knew heart rate was high but does not know how to count rate so not sure how fast. Continued like this throughout the day.  Took ambien and was able to sleep. This AM went to pharmacy and had heart rate checked and it was 111. Does not know blood pressure.  Feeling fatigued but no chest pain or shortness of breath. Saw primary MD on Monday and heart rate was 60.  Will review with Dr. Graciela Husbands.

## 2010-10-31 NOTE — Telephone Encounter (Signed)
Reviewed with Dr. Graciela Husbands who would like pt to come to office today for EKG. Pt aware and will have someone bring her around 2:00

## 2010-11-01 ENCOUNTER — Telehealth: Payer: Self-pay | Admitting: Internal Medicine

## 2010-11-01 DIAGNOSIS — I4891 Unspecified atrial fibrillation: Secondary | ICD-10-CM

## 2010-11-01 NOTE — Telephone Encounter (Signed)
Dr Viona Gilmore Pilant calling about results of ECHO and to give [name] her pulse readings--she states her pulse today is about 111bpm, and she wants to know if she can go out to a meeting tonite--nt

## 2010-11-01 NOTE — Telephone Encounter (Signed)
Pt had echo yesterday wants to know results and tell you her pulse readings please call

## 2010-11-05 ENCOUNTER — Telehealth: Payer: Self-pay | Admitting: Internal Medicine

## 2010-11-05 NOTE — Telephone Encounter (Signed)
i spoke wth patient.  She is in need of extrraction but this will be need to be done apparently because of infecton.  Thus , it seems to need to take priiority over cardioversion.  The  Plan then is for her to discuss with her dentist about stopping coumadin and undergoing extraction and to find out   when her anticoagultion can be resummed and she will be admitted for TEE DCCV with change of antiarrhytyhmic drug from sotalol which is not working to 1C as her mtyoview was normal in Jan 2012

## 2010-11-05 NOTE — Telephone Encounter (Signed)
Ms Lavery calling stating she would like to know the results of EKG ? --and she is scheduled for dental extraction 9/25 and is on coumadin--advised--per dr Arvella Merles dental extraction until off coumadin for at least 5 days --pt states she is coming in 9/25 for PT/INR --advised to speak with someone in CVRR tomorrow and let them know what is going on--pt states she remains in a-fib--but agrees to all of the above--nt

## 2010-11-05 NOTE — Telephone Encounter (Signed)
Pt calling to speak w/ Dr. Graciela Husbands. Please contact Dr. Graciela Husbands and tell him to return pt call.

## 2010-11-05 NOTE — Telephone Encounter (Signed)
Pt calling wanting to know the results of pt ECHO. Pt was suppose to have tooth extraction tomorrow and is c/o a-fib and wants to know if pt can have tooth extraction. Please return pt call to discuss further.

## 2010-11-05 NOTE — Telephone Encounter (Signed)
Spoke with pt who reports she is calling to let Dr. Graciela Husbands know she has been in touch with her oral surgeon who feels it is OK to proceed with tooth extraction tomorrow. Pt has stopped Coumadin today. I told pt I would forward information to Dr. Graciela Husbands

## 2010-11-05 NOTE — Telephone Encounter (Signed)
Patient calling back to speak with nurse.

## 2010-11-06 ENCOUNTER — Encounter: Payer: Medicare Other | Admitting: *Deleted

## 2010-11-06 ENCOUNTER — Telehealth: Payer: Self-pay | Admitting: Internal Medicine

## 2010-11-06 MED ORDER — WARFARIN SODIUM 5 MG PO TABS: 5.0000 mg | ORAL_TABLET | Freq: Every day | ORAL | Status: DC

## 2010-11-06 NOTE — Telephone Encounter (Signed)
Another note already open will close this encounter.

## 2010-11-06 NOTE — Telephone Encounter (Signed)
Will forward to Dr. Klein. 

## 2010-11-06 NOTE — Telephone Encounter (Signed)
Pt daughter calling wanting to know if pt needs continue to stay on sotalol HCL 80 mg before pt has cardioversion.

## 2010-11-06 NOTE — Telephone Encounter (Signed)
I reviewed with Dr. Graciela Husbands, the patient can go ahead and stop sotalol. She needs TEE with DCCV after warfarin is restarted. Sherri Rad, RN, BSN  I spoke with the patient and she had her tooth extraction today. She is restarting warfarin tomorrow. We will set her up for TEE w/ DCCV. Will review with Dr. Graciela Husbands to see if we can schedule for possibly Friday or Monday depending on anesthesia and MD availability. Sherri Rad, RN, BSN

## 2010-11-06 NOTE — Telephone Encounter (Signed)
Per pt daughter Marisue Ivan call they need to know if pt needs to continue Sotalol HCL 80mg  prior to cardioversion

## 2010-11-06 NOTE — Progress Notes (Signed)
  HPI  [name] is a 77 y.o. female  Seen in followup for atrial fibrillation requiring cardioversion and profound post cardioversion bradycardia requiring sympathomimetic support. This proempted pacemaker implantation February 2012.  From a arrhythmia point of view, she had been doing well except for scant palpitations. She was in [place] last month and developed recurrent tachypalpitations. She had 2 episodes one of which converted spontaneously and the other which required DC cardioversion. She was treated with sotalol. It is noteworthy that she was on Celexa for depression.    Laboratories at that time demonstrated creatinine of 0.6 consistent with a GFR of greater than 60Her major issue is depression and anxiety. She is seeking counseling and pharmacological therapy.  Myoview 2011 demonstrated no ischemia Past Medical History  Diagnosis Date  . ABNORMAL ELECTROCARDIOGRAM   . ACID REFLUX DISEASE   . ANEMIA-NOS   . Atrial fibrillation   . BREAST CANCER   . CHF   . COPD   . DEPRESSION/ANXIETY   . DIVERTICULITIS, HX OF   . DYSGEUSIA   . Essential hypertension, benign   . FATIGUE   . GLAUCOMA   . Shortness of breath   . Pacemaker   . LYMPHEDEMA, RIGHT ARM     Past Surgical History  Procedure Date  . Mitral valve repair 2002  . Esophagogastrodenoscopy   . Partial colectomy   . Shoulder surgery   . Mastectomy     right breast     Current Outpatient Prescriptions  Medication Sig Dispense Refill  . clonazePAM (KLONOPIN) 1 MG tablet Take 1 mg by mouth as needed.        . mometasone (NASONEX) 50 MCG/ACT nasal spray 2 sprays by Nasal route daily.        . sotalol (BETAPACE) 80 MG tablet Take 80 mg by mouth 2 (two) times daily.        . timolol (BETIMOL) 0.5 % ophthalmic solution 1 drop as directed.        . tiotropium (SPIRIVA) 18 MCG inhalation capsule Place 18 mcg into inhaler and inhale as directed.        . vitamin B-12 (CYANOCOBALAMIN) 1000 MCG tablet Take 1,000  mcg by mouth daily.        Marland Kitchen zolpidem (AMBIEN) 10 MG tablet Take 10 mg by mouth at bedtime as needed.          Allergies  Allergen Reactions  . Codeine     REACTION: Nausea and cold sweats  . Propafenone Hcl     REACTION: Reaction not known    Review of Systems negative except from HPI and PMH  Physical Exam Well developed and well nourished in no acute distress HENT normal E scleral and icterus clear Neck Supple JVP flat; carotids brisk and full Clear to ausculation irreglarly rapid rate  Soft with active bowel sounds No clubbing cyanosis and edema Alert and oriented, grossly normal motor and sensory function Skin Warm and Dry  ECG AFib RVR  Assessment and  Plan

## 2010-11-07 ENCOUNTER — Encounter: Payer: Self-pay | Admitting: *Deleted

## 2010-11-07 NOTE — Telephone Encounter (Signed)
Dr [place] is doing the DCCV

## 2010-11-07 NOTE — Telephone Encounter (Signed)
Called and spoke with daughter  They can come on Friday morning for pre-DCCV labs and will have the TEE/ DCCV on Mon 11/12/10 at 10:30am with Dr Patty Sermons

## 2010-11-09 ENCOUNTER — Other Ambulatory Visit (INDEPENDENT_AMBULATORY_CARE_PROVIDER_SITE_OTHER): Payer: Medicare Other | Admitting: *Deleted

## 2010-11-09 DIAGNOSIS — I4891 Unspecified atrial fibrillation: Secondary | ICD-10-CM

## 2010-11-12 ENCOUNTER — Ambulatory Visit (HOSPITAL_COMMUNITY): Admit: 2010-11-12 | Payer: Self-pay | Admitting: Cardiology

## 2010-11-12 ENCOUNTER — Telehealth: Payer: Self-pay | Admitting: Internal Medicine

## 2010-11-12 ENCOUNTER — Ambulatory Visit (INDEPENDENT_AMBULATORY_CARE_PROVIDER_SITE_OTHER): Payer: Medicare Other | Admitting: *Deleted

## 2010-11-12 ENCOUNTER — Encounter: Payer: Self-pay | Admitting: *Deleted

## 2010-11-12 ENCOUNTER — Ambulatory Visit (HOSPITAL_COMMUNITY): Admission: RE | Admit: 2010-11-12 | Payer: Self-pay | Source: Ambulatory Visit | Admitting: Cardiology

## 2010-11-12 DIAGNOSIS — I4891 Unspecified atrial fibrillation: Secondary | ICD-10-CM

## 2010-11-12 LAB — CBC WITH DIFFERENTIAL/PLATELET
Basophils Absolute: 0 10*3/uL (ref 0.0–0.1)
Basophils Relative: 0.1 % (ref 0.0–3.0)
Eosinophils Absolute: 0.1 10*3/uL (ref 0.0–0.7)
Eosinophils Relative: 1.9 % (ref 0.0–5.0)
HCT: 45.3 % (ref 36.0–46.0)
Hemoglobin: 15 g/dL (ref 12.0–15.0)
Lymphocytes Relative: 17.9 % (ref 12.0–46.0)
Lymphs Abs: 1.3 10*3/uL (ref 0.7–4.0)
MCHC: 33.1 g/dL (ref 30.0–36.0)
MCV: 98.2 fl (ref 78.0–100.0)
Monocytes Absolute: 0.6 10*3/uL (ref 0.1–1.0)
Monocytes Relative: 8.6 % (ref 3.0–12.0)
Neutro Abs: 5.2 10*3/uL (ref 1.4–7.7)
Neutrophils Relative %: 71.5 % (ref 43.0–77.0)
Platelets: 173 10*3/uL (ref 150.0–400.0)
RBC: 4.61 Mil/uL (ref 3.87–5.11)
RDW: 14.2 % (ref 11.5–14.6)
WBC: 7.2 10*3/uL (ref 4.5–10.5)

## 2010-11-12 LAB — BASIC METABOLIC PANEL
BUN: 12 mg/dL (ref 6–23)
CO2: 23 mEq/L (ref 19–32)
Calcium: 9 mg/dL (ref 8.4–10.5)
Chloride: 101 mEq/L (ref 96–112)
Creatinine, Ser: 0.5 mg/dL (ref 0.4–1.2)
GFR: 114.02 mL/min (ref >60.00)
Glucose, Bld: 108 mg/dL — ABNORMAL HIGH (ref 70–99)
Potassium: 4.7 mEq/L (ref 3.5–5.1)
Sodium: 135 mEq/L (ref 135–145)

## 2010-11-12 LAB — MAGNESIUM: Magnesium: 2 mg/dL (ref 1.5–2.5)

## 2010-11-12 MED ORDER — FLECAINIDE ACETATE 100 MG PO TABS: 100.0000 mg | ORAL_TABLET | Freq: Two times a day (BID) | ORAL | Status: DC

## 2010-11-12 NOTE — Telephone Encounter (Signed)
I spoke with the patient again and she verbalizes understanding of the date/time/place for her procedure. She states she would like Mellody Dance to draw her blood. I explained if she wants this done, she will need to come today for this. She states she will come around 12-12:30pm today to have this done. Also per Dr. Graciela Husbands, he will be starting the patient on flecainide 100mg  twice daily. The patient may start today per Dr. Graciela Husbands or post cardioversion. I have made the patient aware of this and she voices understanding. She wanted a prescription sent in to [facility]. This has been done for her.

## 2010-11-12 NOTE — Telephone Encounter (Signed)
I spoke with the patient. She is scheduled for 11/14/10 and is agreeable with this.

## 2010-11-12 NOTE — Telephone Encounter (Signed)
Pt was told cardioversion was tomorrow, appt tomorrow is office visit with Shea Stakes has pt scheduled today by you with karen for the cardioversion with [place], dr [place] says he cannot do it today and Annice Pih would like to speak with you

## 2010-11-12 NOTE — Telephone Encounter (Signed)
Per Dr. Graciela Husbands, we can draw the patient's labs at the hospital tomorrow. We will obtain, a bmp/cbc/INR on arrival. I have contacted the patient to let her know this. She states she did not pick up her instructions when she was here Friday for her labwork. She does not have anything to write with. I will call her back in a few minutes. She is agreeable.

## 2010-11-12 NOTE — Telephone Encounter (Signed)
I spoke with the patient. She states she did not pick up her instructions for her procedure on Friday because she was too upset after attempts at being stuck for labs unsuccessfully. The call I received this morning stated the patient "needed to know what was going on for tomorrow." I did not look at the date of her procedure closely, just the time. In fact the patient was due for her procedure today, but since she did not get her instructions on Friday, she did not know not to eat anything, and had already done this. I have explained to the patient that we would reschedule her for this week. I have spoken with [facility] scheduling as well. The patient is scheduled for 11/14/10 @ 11:30 for TEE and 12:00pm for DCCV. I have left a message for the patient to call.

## 2010-11-12 NOTE — Telephone Encounter (Signed)
Pt calling wanting to know about when pt should come in for lab work. Pt came on Friday and lab couldn't get in pt veins. Pt also wants to confirm what is happening tomorrow. Please return pt call to advise/discuss further.

## 2010-11-12 NOTE — Telephone Encounter (Signed)
Pt returning your call

## 2010-11-13 ENCOUNTER — Telehealth: Payer: Self-pay | Admitting: Internal Medicine

## 2010-11-13 ENCOUNTER — Encounter: Payer: Medicare Other | Admitting: Internal Medicine

## 2010-11-13 NOTE — Telephone Encounter (Signed)
I spoke with the patient and answered her questions.

## 2010-11-13 NOTE — Telephone Encounter (Signed)
Pt wants to talk to you procedure she is having tomorrow

## 2010-11-14 ENCOUNTER — Ambulatory Visit (HOSPITAL_COMMUNITY)
Admission: RE | Admit: 2010-11-14 | Discharge: 2010-11-14 | Disposition: A | Discharge status: Home / Self Care | Payer: Medicare Other | Source: Ambulatory Visit | Attending: Cardiovascular Disease | Admitting: Cardiovascular Disease

## 2010-11-14 DIAGNOSIS — Z01810 Encounter for preprocedural cardiovascular examination: Secondary | ICD-10-CM | POA: Insufficient documentation

## 2010-11-14 DIAGNOSIS — Z538 Procedure and treatment not carried out for other reasons: Secondary | ICD-10-CM | POA: Insufficient documentation

## 2010-11-14 DIAGNOSIS — I4891 Unspecified atrial fibrillation: Secondary | ICD-10-CM | POA: Insufficient documentation

## 2010-11-15 ENCOUNTER — Telehealth: Payer: Self-pay | Admitting: Internal Medicine

## 2010-11-15 NOTE — Telephone Encounter (Signed)
Pt calling re bp going way up

## 2010-11-15 NOTE — Telephone Encounter (Signed)
I left a message for the patient to call. 

## 2010-11-16 NOTE — Telephone Encounter (Signed)
Please call pt-returning your call.

## 2010-11-19 ENCOUNTER — Ambulatory Visit (INDEPENDENT_AMBULATORY_CARE_PROVIDER_SITE_OTHER): Payer: Medicare Other | Admitting: *Deleted

## 2010-11-19 ENCOUNTER — Ambulatory Visit (INDEPENDENT_AMBULATORY_CARE_PROVIDER_SITE_OTHER): Payer: Medicare Other | Admitting: Internal Medicine

## 2010-11-19 VITALS — BP 122/84 | HR 87 | Ht 62.0 in | Wt 130.8 lb

## 2010-11-19 DIAGNOSIS — J449 Chronic obstructive pulmonary disease, unspecified: Secondary | ICD-10-CM

## 2010-11-19 DIAGNOSIS — I4891 Unspecified atrial fibrillation: Secondary | ICD-10-CM

## 2010-11-19 DIAGNOSIS — I509 Heart failure, unspecified: Secondary | ICD-10-CM

## 2010-11-19 NOTE — Assessment & Plan Note (Signed)
dypsnea in part 2/2 diastolic dysfunction with atrial arrhythmia

## 2010-11-19 NOTE — Assessment & Plan Note (Signed)
Pt has persistent atrial tachy proboby maintained as a consequence of the flecanide.  She will need to undergo DCCV once therapeutic for 3 weeks  Hopefully some of her exercise tolerance will improve at that point,  If dyspnea continues, she would like to referral to pulm

## 2010-11-19 NOTE — Progress Notes (Signed)
  HPI  [name] is a 77 y.o. female Seen in followup for atrial fibrillation requiring cardioversion and profound post cardioversion bradycardia requiring sympathomimetic support. This proempted pacemaker implantation February 2012.  From a arrhythmia point of view, she had been doing well except for scant palpitations. She was in [place] last month and developed recurrent tachypalpitations. She had 2 episodes one of which converted spontaneously and the other which required DC cardioversion. She was treated with sotalol  Because of recurrent AF this was stopped and she was scheduled for TEEDCCV and showed up lst week  Her ECG was erroneously interpreted as sinus rhythm and she was dischaggd   SHe comes intoday and is still struggling with dyspnea but without palps  She is also extremely frutstrated with her family as they have taken away her car  She feels like she is in a cage  Past Medical History  Diagnosis Date  . ABNORMAL ELECTROCARDIOGRAM   . ACID REFLUX DISEASE   . ANEMIA-NOS   . Atrial fibrillation   . BREAST CANCER   . CHF   . COPD   . DEPRESSION/ANXIETY   . DIVERTICULITIS, HX OF   . DYSGEUSIA   . Essential hypertension, benign   . FATIGUE   . GLAUCOMA   . Shortness of breath   . Pacemaker   . LYMPHEDEMA, RIGHT ARM     Past Surgical History  Procedure Date  . Mitral valve repair 2002  . Esophagogastrodenoscopy   . Partial colectomy   . Shoulder surgery   . Mastectomy     right breast     Current Outpatient Prescriptions  Medication Sig Dispense Refill  . clonazePAM (KLONOPIN) 1 MG tablet Take 1 mg by mouth as needed.        . flecainide (TAMBOCOR) 100 MG tablet Take 1 tablet (100 mg total) by mouth 2 (two) times daily.  60 tablet  6  . mometasone (NASONEX) 50 MCG/ACT nasal spray 2 sprays by Nasal route daily.        . timolol (BETIMOL) 0.5 % ophthalmic solution 1 drop as directed.        . tiotropium (SPIRIVA) 18 MCG inhalation capsule Place 18 mcg into  inhaler and inhale as directed.        . vitamin B-12 (CYANOCOBALAMIN) 1000 MCG tablet Take 1,000 mcg by mouth daily.        Marland Kitchen warfarin (COUMADIN) 5 MG tablet Take 1 tablet (5 mg total) by mouth daily.  30 tablet  11  . zolpidem (AMBIEN) 10 MG tablet Take 10 mg by mouth at bedtime as needed.          Allergies  Allergen Reactions  . Codeine     REACTION: Nausea and cold sweats  . Propafenone Hcl     REACTION: Reaction not known    Review of Systems negative except from HPI and PMH  Physical Exam Well developed and well nourished in no acute distress HENT normal E scleral and icterus clear Neck Supple JVP flat; carotids brisk and full Clear to ausculation Regular rate and rhythm, no murmurs gallops or rub Soft with active bowel sounds No clubbing cyanosis and edema Alert and oriented, grossly normal motor and sensory function Skin Warm and Dry  ECG atrial tach with 2:1 conduction   This is similar rate but different pwave axis from last week  Assessment and  Plan

## 2010-11-19 NOTE — Assessment & Plan Note (Signed)
Will refer to pulm after DCCV

## 2010-11-20 ENCOUNTER — Encounter: Payer: Self-pay | Admitting: Internal Medicine

## 2010-11-20 NOTE — Telephone Encounter (Signed)
I was not here on Friday when this patient called back. Call did not go to triage. The patient did have an office visit with Dr. Graciela Husbands on 11/19/10.

## 2010-11-21 ENCOUNTER — Telehealth: Payer: Self-pay | Admitting: Internal Medicine

## 2010-11-21 NOTE — Telephone Encounter (Signed)
Pt wants to know if she can use treadmill please call

## 2010-11-21 NOTE — Telephone Encounter (Signed)
I talked with pt. Pt asking if OK to use an exercise bike or a treadmill. Pt states she had used both of these several months ago but wants to know if OK to use now with her current medical situation.  She would like Dr [name] OK on this. I will forward to Dr Graciela Husbands for review and recommendations.

## 2010-11-22 NOTE — Telephone Encounter (Signed)
She can exercise as she can tolerate thjanks

## 2010-11-22 NOTE — Telephone Encounter (Signed)
I left a message for the patient that we still need to review with Dr. Graciela Husbands about the bike or treadmill. I explained if she was calling back this morning about something different, to please call me back, but if her call was in regards to exercise, I will call her back later today.

## 2010-11-22 NOTE — Telephone Encounter (Signed)
Pt called. Please return her call

## 2010-11-22 NOTE — Telephone Encounter (Signed)
The patient is aware of Dr. Klein's recommendations. 

## 2010-11-26 ENCOUNTER — Encounter: Payer: Medicare Other | Admitting: *Deleted

## 2010-11-27 ENCOUNTER — Encounter: Payer: Self-pay | Admitting: *Deleted

## 2010-11-27 ENCOUNTER — Encounter: Payer: Medicare Other | Admitting: *Deleted

## 2010-11-27 ENCOUNTER — Encounter: Payer: Medicare Other | Admitting: Internal Medicine

## 2010-11-27 ENCOUNTER — Ambulatory Visit (INDEPENDENT_AMBULATORY_CARE_PROVIDER_SITE_OTHER): Payer: Medicare Other | Admitting: *Deleted

## 2010-11-27 ENCOUNTER — Telehealth: Payer: Self-pay | Admitting: *Deleted

## 2010-11-27 DIAGNOSIS — I4891 Unspecified atrial fibrillation: Secondary | ICD-10-CM

## 2010-11-27 LAB — POCT INR: INR: 1.4

## 2010-11-27 NOTE — Telephone Encounter (Signed)
I left a message on the patient's identified voice mail that her DCCV has been scheduled for Monday 12/03/10 with Dr. Graciela Husbands at 1:00pm. I have stated that I will leave a written copy of her instructions in CVRR today for her appointment at 1:45pm.

## 2010-12-03 ENCOUNTER — Ambulatory Visit (HOSPITAL_COMMUNITY)
Admission: RE | Admit: 2010-12-03 | Discharge: 2010-12-03 | Disposition: A | Discharge status: Home / Self Care | Payer: Medicare Other | Source: Ambulatory Visit | Attending: Internal Medicine | Admitting: Internal Medicine

## 2010-12-03 ENCOUNTER — Ambulatory Visit (INDEPENDENT_AMBULATORY_CARE_PROVIDER_SITE_OTHER): Payer: Medicare Other | Admitting: *Deleted

## 2010-12-03 DIAGNOSIS — I498 Other specified cardiac arrhythmias: Secondary | ICD-10-CM | POA: Insufficient documentation

## 2010-12-03 DIAGNOSIS — R000 Tachycardia, unspecified: Secondary | ICD-10-CM

## 2010-12-03 DIAGNOSIS — I4891 Unspecified atrial fibrillation: Secondary | ICD-10-CM

## 2010-12-03 DIAGNOSIS — Z950 Presence of cardiac pacemaker: Secondary | ICD-10-CM | POA: Insufficient documentation

## 2010-12-03 LAB — POCT INR: INR: 2.5

## 2010-12-07 NOTE — Discharge Summary (Signed)
  NAMECHASE, KNEBEL NO.:  [id number]  MEDICAL RECORD NO.:  [id number]  LOCATION:  [facility]                         FACILITY:  [facility]  PHYSICIAN:  Pricilla Riffle, MD, FACCDATE OF BIRTH:  06/16/1923  DATE OF ADMISSION:  12/03/2010 DATE OF DISCHARGE:                              DISCHARGE SUMMARY   IDENTIFICATION:  The patient is an 77 year old with atrial tachycardia. Plan for cardioversion.  The patient was anesthetized per Anesthesia with 75 mg propofol intravenously with the pads in the AP position.  Cardioversion attempt was made with 150 joules synchronized biphasic energy.  This was unsuccessful as determined by interrogation of a pacemaker.  A repeat attempt was made with 200 joules synchronized biphasic energy again unsuccessful.  Pads were moved to the right parasternal, apical, right parasternal posterior position.  Again 200 joules synchronized biphasic energy was applied.  There was transient conversion to sinus rhythm. The patient intermittently went back into atrial tach and back into sinus rhythm.  Procedure was terminated.  There were no complications.     Pricilla Riffle, MD, [facility]     PVR/MEDQ  D:  12/03/2010  T:  12/04/2010  Job:  [id number]  Electronically Signed by Dietrich Pates MD [facility] on 12/07/2010 01:49:37 PM

## 2010-12-10 ENCOUNTER — Telehealth: Payer: Self-pay | Admitting: Internal Medicine

## 2010-12-10 ENCOUNTER — Ambulatory Visit (INDEPENDENT_AMBULATORY_CARE_PROVIDER_SITE_OTHER): Payer: Medicare Other | Admitting: *Deleted

## 2010-12-10 ENCOUNTER — Telehealth: Payer: Self-pay | Admitting: *Deleted

## 2010-12-10 DIAGNOSIS — I4891 Unspecified atrial fibrillation: Secondary | ICD-10-CM

## 2010-12-10 LAB — POCT INR: INR: 2.1

## 2010-12-10 NOTE — Telephone Encounter (Signed)
[facility]  appt post cardioversion. Mylo Red RN

## 2010-12-10 NOTE — Telephone Encounter (Signed)
Walk in pt Form " Pt needs to make appt" sent to Message Nurse  12/10/10/km

## 2010-12-11 ENCOUNTER — Other Ambulatory Visit: Payer: Self-pay | Admitting: Dermatology

## 2010-12-11 NOTE — Telephone Encounter (Signed)
I left a message for the patient to call and see if she can come on Monday 12/17/10 at 4:15pm.

## 2010-12-12 NOTE — Telephone Encounter (Signed)
I received a call back from the patient stating that Monday 11/5 at 4:15pm is ok.

## 2010-12-17 ENCOUNTER — Encounter: Payer: Medicare Other | Admitting: *Deleted

## 2010-12-17 ENCOUNTER — Encounter: Payer: Self-pay | Admitting: Internal Medicine

## 2010-12-17 ENCOUNTER — Ambulatory Visit (INDEPENDENT_AMBULATORY_CARE_PROVIDER_SITE_OTHER): Payer: Medicare Other | Admitting: Internal Medicine

## 2010-12-17 DIAGNOSIS — I4891 Unspecified atrial fibrillation: Secondary | ICD-10-CM

## 2010-12-17 DIAGNOSIS — F341 Dysthymic disorder: Secondary | ICD-10-CM

## 2010-12-17 DIAGNOSIS — Z950 Presence of cardiac pacemaker: Secondary | ICD-10-CM

## 2010-12-17 DIAGNOSIS — I498 Other specified cardiac arrhythmias: Secondary | ICD-10-CM

## 2010-12-17 DIAGNOSIS — I509 Heart failure, unspecified: Secondary | ICD-10-CM

## 2010-12-17 LAB — PACEMAKER DEVICE OBSERVATION
AL AMPLITUDE: 4 mv
BATTERY VOLTAGE: 2.9779 V
BRDY-0002RV: 70 {beats}/min
DEVICE MODEL PM: 7209799
RV LEAD AMPLITUDE: 12 mv
RV LEAD IMPEDENCE PM: 675 Ohm

## 2010-12-17 MED ORDER — AMIODARONE HCL 400 MG PO TABS: ORAL_TABLET | ORAL | Status: DC

## 2010-12-17 NOTE — Progress Notes (Signed)
  HPI  [name] is a 77 y.o. female Seen in followup for recurrence of atrial arrhythmias. 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 We recently attempted cardioversion in the context of flecainide therapy; unfortunately she has failed and has reverted to atrial arrhythmia.  She feels anxious depressed. She saw a physician who gave her a prescription of a medication for her depression. She tried a dose or 2 and then stopped.  Past Medical History  Diagnosis Date  . ABNORMAL ELECTROCARDIOGRAM   . ACID REFLUX DISEASE   . ANEMIA-NOS   . Atrial fibrillation   . BREAST CANCER   . CHF   . COPD   . DEPRESSION/ANXIETY   . DIVERTICULITIS, HX OF   . DYSGEUSIA   . Essential hypertension, benign   . FATIGUE   . GLAUCOMA   . Shortness of breath   . Pacemaker   . LYMPHEDEMA, RIGHT ARM     Past Surgical History  Procedure Date  . Mitral valve repair 2002  . Esophagogastrodenoscopy   . Partial colectomy   . Shoulder surgery   . Mastectomy     right breast     Current Outpatient Prescriptions  Medication Sig Dispense Refill  . clonazePAM (KLONOPIN) 1 MG tablet Take 1 mg by mouth as needed.        . flecainide (TAMBOCOR) 100 MG tablet Take 1 tablet (100 mg total) by mouth 2 (two) times daily.  60 tablet  6  . mometasone (NASONEX) 50 MCG/ACT nasal spray 2 sprays by Nasal route daily.        . timolol (BETIMOL) 0.5 % ophthalmic solution 1 drop as directed.        . tiotropium (SPIRIVA) 18 MCG inhalation capsule Place 18 mcg into inhaler and inhale as directed.        . vitamin B-12 (CYANOCOBALAMIN) 1000 MCG tablet Take 1,000 mcg by mouth daily.        Marland Kitchen warfarin (COUMADIN) 5 MG tablet Take 1 tablet (5 mg total) by mouth daily.  30 tablet  11  . zolpidem (AMBIEN) 10 MG tablet Take 10 mg by mouth at bedtime as needed.          Allergies  Allergen Reactions  . Codeine     REACTION: Nausea and cold sweats  . Propafenone Hcl     REACTION:  Reaction not known    Review of Systems negative except from HPI and PMH  Physical Exam Well developed and well nourished in no acute distress HENT normal E scleral and icterus clear Neck Supple JVP flat; carotids brisk and full Clear to ausculation Irregular rate and rhythm with a 2/6 systolic murmurSoft with active bowel sounds No clubbing cyanosis and edema Alert and oriented, grossly normal motor and sensory function Skin Warm and Dry Affect somewhat sad ECG Atrial tachycardia with some ventricular pacing in aP synchronous fashion Assessment and  Plan

## 2010-12-17 NOTE — Assessment & Plan Note (Signed)
I suggested that she establish with a psychiatrist  With geriatric expertise.

## 2010-12-17 NOTE — Patient Instructions (Addendum)
Your physician has recommended you make the following change in your medication:  1) Stop Flecainide 2) On Monday 12/24/10 - start amiodarone 400mg  one tablet by mouth twice daily for a week, then decrease amiodarone to 400mg  once daily. 3) On Monday 12/24/10- decrease coumadin to 1/2 tablet once every day.  You will need to have weekly coumadin checks done for the next 4 weeks. You have been given an order for this. Please make sure you bring a copy of your results home with you.  Please call Herbert Seta when you are back in town- [phone].

## 2010-12-17 NOTE — Assessment & Plan Note (Signed)
Stable

## 2010-12-17 NOTE — Assessment & Plan Note (Signed)
She has reverted to atrial fibrillation/flutter. The flecainide has failed. I will begin her on amiodarone. I have reviewed with her side effects. We also have to worry about the interaction with Coumadin.  We will plan to initiate amiodarone next week. At that time will decrease her INR to a half a tablet daily from just Monday Wednesday Friday. She will get her Coumadin checked the following week when she is in [place]. We'll anticipate cardioversion a week or so after she returns assuming that her INRs have been therapeutic.

## 2010-12-17 NOTE — Assessment & Plan Note (Signed)
The patient's device was interrogated and the information was fully reviewed.  The device was reprogrammed to DD IR mode to prevent tracking of the atrial arrhythmia

## 2010-12-18 ENCOUNTER — Telehealth: Payer: Self-pay | Admitting: Internal Medicine

## 2010-12-18 NOTE — Telephone Encounter (Signed)
Pharmacy called to confirm that pt is being followed closely by the anticoagulation clinic due to the increased effect that amiodarone has on Warfarin.  I confirmed that pt is being followed by the anticoagulation clinic.

## 2010-12-18 NOTE — Telephone Encounter (Signed)
[facility] called about amiodorone possible side effects please call

## 2010-12-18 NOTE — Telephone Encounter (Signed)
NA. No voicemail.  

## 2010-12-20 ENCOUNTER — Ambulatory Visit (INDEPENDENT_AMBULATORY_CARE_PROVIDER_SITE_OTHER): Payer: Medicare Other

## 2010-12-20 ENCOUNTER — Ambulatory Visit (INDEPENDENT_AMBULATORY_CARE_PROVIDER_SITE_OTHER): Payer: Medicare Other | Admitting: *Deleted

## 2010-12-20 DIAGNOSIS — I4892 Unspecified atrial flutter: Secondary | ICD-10-CM

## 2010-12-20 DIAGNOSIS — I4891 Unspecified atrial fibrillation: Secondary | ICD-10-CM

## 2010-12-20 LAB — POCT INR: INR: 1.8

## 2010-12-20 MED ORDER — DILTIAZEM HCL 120 MG PO TABS: ORAL_TABLET | ORAL | Status: DC

## 2010-12-20 NOTE — Patient Instructions (Signed)
Your physician has recommended you make the following change in your medication:  1) Start Diltiazem 120mg  twice daily.  Call Dr. [name] nurse, Herbert Seta, tomorrow morning and let me know how you are doing. The best option would be to start amiodarone 400mg  three times a day and plan for a TEE w/ Cardioversion early to mid next week.

## 2010-12-20 NOTE — Progress Notes (Signed)
The patient was in the office today for a coumadin check. INR 1.8. She complains of feeling SOB this morning and not being able to hardly get out of bed. She felt like her HR's have been fast today. Her EKG this afternoon reveals a-flutter with 2:1 AV conduction with a rate of 129bpm. I spoke with Dr. Graciela Husbands and he recommended that the best option would be to start the patient on amiodarone 400mg  three times daily with plans for a TEE with Cardioversion early to mid next week. The patient is to go out of town on Monday for 2 weeks. He has also recommended Diltiazem 120mg  twice daily for rate control now. I have advised the patient of Dr. [name] recommendations. She will start the Diltiazem tonight and call me tomorrow with how she is doing. She will decide at that point if she will postpone her travel plans in order to proceed with Cardioversion.

## 2010-12-21 ENCOUNTER — Encounter: Payer: Medicare Other | Admitting: *Deleted

## 2010-12-21 ENCOUNTER — Telehealth: Payer: Self-pay | Admitting: *Deleted

## 2010-12-21 NOTE — Telephone Encounter (Signed)
The patient called this morning in followup to her nurse visit from yesterday. She states that she thinks her rates are down. She would like to give it one more day to see how she feels. I explained that with tomorrow being Saturday, that we should have a plan for the weekend from Dr. Graciela Husbands, that if her HR goes back up, she will know what to do. She states her son is still wanting her to come for the holiday, which would mean she would be leaving on Monday. I explained I would review with Dr. Graciela Husbands and call her back. She would also like the name of a geriatric pyshciatrist. I explained I would review with Dr. Graciela Husbands and call her back. She is agreeable. Sherri Rad, RN, BSN   I spoke with Dr. Graciela Husbands, he recommends that if over the weekend, her heart rates become elevated, she can take an extra dose of the diltiazem.  He also recommends Dr. [facility]: [address] [place], [place] [address] [phone] I have attempted to call the patient. I left a message at both her home and cell #'s to call back.Sherri Rad, RN, BSN

## 2010-12-21 NOTE — Telephone Encounter (Signed)
I spoke with the patient. She is aware of Dr. [name] recommendations. I have asked that she call my number and leave me a message this weekend if she decides to go out of town so I will know where she is at. She has also been given Dr. Caprice Renshaw information.

## 2010-12-25 ENCOUNTER — Encounter: Payer: Medicare Other | Admitting: *Deleted

## 2010-12-28 ENCOUNTER — Encounter: Payer: Self-pay | Admitting: Internal Medicine

## 2010-12-28 LAB — PROTIME-INR

## 2011-01-01 NOTE — Progress Notes (Signed)
Addended by: Micki Riley C on: 01/01/2011 02:23 PM   Modules accepted: Orders

## 2011-01-08 LAB — PROTIME-INR

## 2011-01-09 ENCOUNTER — Encounter: Payer: Self-pay | Admitting: Internal Medicine

## 2011-01-09 ENCOUNTER — Ambulatory Visit (INDEPENDENT_AMBULATORY_CARE_PROVIDER_SITE_OTHER): Payer: Self-pay | Admitting: *Deleted

## 2011-01-09 DIAGNOSIS — I4891 Unspecified atrial fibrillation: Secondary | ICD-10-CM

## 2011-01-09 DIAGNOSIS — R0989 Other specified symptoms and signs involving the circulatory and respiratory systems: Secondary | ICD-10-CM

## 2011-01-15 ENCOUNTER — Telehealth: Payer: Self-pay | Admitting: *Deleted

## 2011-01-15 NOTE — Telephone Encounter (Signed)
I spoke with the patient. She is back in town. I received copies of her labs from 11/16- INR 2.58 & 11/27- INR 5.9. Her elevated reading was brought to the attention of [facility] and they have managed this. She is to come in tomorrow for a repeat INR. In speaking with her, she states she feels like her heart rates are still elevated. I explained to her that if her INR reading is ok tomorrow, I will look at setting her up for DCCV on Monday 01/21/11 with Dr. Graciela Husbands. She is agreeable with Monday since her daughter will be available to bring her.

## 2011-01-16 ENCOUNTER — Ambulatory Visit (INDEPENDENT_AMBULATORY_CARE_PROVIDER_SITE_OTHER): Payer: Medicare Other | Admitting: *Deleted

## 2011-01-16 DIAGNOSIS — I4891 Unspecified atrial fibrillation: Secondary | ICD-10-CM

## 2011-01-17 ENCOUNTER — Telehealth: Payer: Self-pay | Admitting: *Deleted

## 2011-01-17 ENCOUNTER — Encounter (HOSPITAL_COMMUNITY): Payer: Self-pay

## 2011-01-17 ENCOUNTER — Encounter: Payer: Self-pay | Admitting: *Deleted

## 2011-01-17 NOTE — Telephone Encounter (Signed)
I spoke with the patient and made her aware that her Cardioversion with Dr. Graciela Husbands is set up for Monday 01/21/11. She is aware that this is scheduled for 10:00am and that she should arrive at 8:00am to Short Stay at [facility]. I have advised that she continue her current meds, but she should be NPO after midnight the night prior. The patient verbalizes understanding.

## 2011-01-17 NOTE — Telephone Encounter (Signed)
I called the patient back and told her I would mail a copy of her instructions to her, but needed to verify her address because the only one we have on file for her is her son's. The patient's address is :[address], [place], [address].

## 2011-01-21 ENCOUNTER — Encounter (HOSPITAL_COMMUNITY): Payer: Self-pay | Admitting: Anesthesiology

## 2011-01-21 ENCOUNTER — Ambulatory Visit (HOSPITAL_COMMUNITY): Payer: Medicare Other | Admitting: Anesthesiology

## 2011-01-21 ENCOUNTER — Encounter (HOSPITAL_COMMUNITY)
Admission: RE | Disposition: A | Discharge status: Home / Self Care | Payer: Self-pay | Source: Ambulatory Visit | Attending: Internal Medicine

## 2011-01-21 ENCOUNTER — Ambulatory Visit (HOSPITAL_COMMUNITY)
Admission: RE | Admit: 2011-01-21 | Discharge: 2011-01-21 | Disposition: A | Discharge status: Home / Self Care | Payer: Medicare Other | Source: Ambulatory Visit | Attending: Internal Medicine | Admitting: Internal Medicine

## 2011-01-21 ENCOUNTER — Other Ambulatory Visit: Payer: Self-pay

## 2011-01-21 DIAGNOSIS — I4892 Unspecified atrial flutter: Secondary | ICD-10-CM

## 2011-01-21 DIAGNOSIS — J449 Chronic obstructive pulmonary disease, unspecified: Secondary | ICD-10-CM | POA: Insufficient documentation

## 2011-01-21 DIAGNOSIS — J4489 Other specified chronic obstructive pulmonary disease: Secondary | ICD-10-CM | POA: Insufficient documentation

## 2011-01-21 DIAGNOSIS — I509 Heart failure, unspecified: Secondary | ICD-10-CM | POA: Insufficient documentation

## 2011-01-21 DIAGNOSIS — I10 Essential (primary) hypertension: Secondary | ICD-10-CM | POA: Insufficient documentation

## 2011-01-21 DIAGNOSIS — K219 Gastro-esophageal reflux disease without esophagitis: Secondary | ICD-10-CM | POA: Insufficient documentation

## 2011-01-21 HISTORY — PX: CARDIOVERSION: SHX1299

## 2011-01-21 LAB — BASIC METABOLIC PANEL
BUN: 16 mg/dL (ref 6–23)
Calcium: 9.6 mg/dL (ref 8.4–10.5)
Chloride: 105 mEq/L (ref 96–112)
Creatinine, Ser: 0.72 mg/dL (ref 0.50–1.10)
GFR calc Af Amer: 88 mL/min — ABNORMAL LOW (ref >90)
GFR calc non Af Amer: 76 mL/min — ABNORMAL LOW (ref >90)

## 2011-01-21 LAB — CBC
HCT: 45.2 % (ref 36.0–46.0)
MCH: 31.5 pg (ref 26.0–34.0)
MCHC: 33.8 g/dL (ref 30.0–36.0)
MCV: 93 fL (ref 78.0–100.0)
Platelets: 189 10*3/uL (ref 150–400)
RDW: 13.6 % (ref 11.5–15.5)
WBC: 5.8 10*3/uL (ref 4.0–10.5)

## 2011-01-21 LAB — PROTIME-INR: Prothrombin Time: 36.3 seconds — ABNORMAL HIGH (ref 11.6–15.2)

## 2011-01-21 SURGERY — CARDIOVERSION
Anesthesia: General | Wound class: Clean

## 2011-01-21 MED ORDER — SODIUM CHLORIDE 0.9 % IJ SOLN: 3.0000 mL | Freq: Two times a day (BID) | INTRAMUSCULAR | Status: DC

## 2011-01-21 MED ORDER — SODIUM CHLORIDE 0.9 % IV SOLN: 250.0000 mL | INTRAVENOUS | Status: DC | PRN

## 2011-01-21 MED ORDER — SODIUM CHLORIDE 0.9 % IJ SOLN: 3.0000 mL | INTRAMUSCULAR | Status: DC | PRN

## 2011-01-21 MED ORDER — HYDROCORTISONE 1 % EX CREA: 1.0000 "application " | TOPICAL_CREAM | Freq: Three times a day (TID) | CUTANEOUS | Status: DC | PRN

## 2011-01-21 MED ORDER — PROPOFOL 10 MG/ML IV BOLUS
INTRAVENOUS | Status: DC | PRN
  Administered 2011-01-21: 100 mg via INTRAVENOUS

## 2011-01-21 MED ORDER — SODIUM CHLORIDE 0.9 % IV SOLN
INTRAVENOUS | Status: DC | PRN
  Administered 2011-01-21: 10:00:00 via INTRAVENOUS

## 2011-01-21 NOTE — Preoperative (Signed)
Beta Blockers   Reason not to administer Beta Blockers:Not Applicable 

## 2011-01-21 NOTE — Anesthesia Preprocedure Evaluation (Addendum)
Anesthesia Evaluation  Patient identified by MRN, date of birth, ID band Patient awake, Patient confused and Patient unresponsive    Reviewed: Allergy & Precautions, H&P , NPO status , Patient's Chart, lab work & pertinent test results  History of Anesthesia Complications Negative for: history of anesthetic complications  Airway Mallampati: II TM Distance: >3 FB Neck ROM: full    Dental  (+) Teeth Intact   Pulmonary shortness of breath and with exertion, COPD COPD inhaler,          Cardiovascular hypertension, Pt. on medications +CHF + dysrhythmias Atrial Fibrillation irregular     Neuro/Psych PSYCHIATRIC DISORDERS Depression Negative Neurological ROS     GI/Hepatic Neg liver ROS, GERD-  Medicated and Controlled,  Endo/Other  Negative Endocrine ROS  Renal/GU negative Renal ROS  Genitourinary negative   Musculoskeletal negative musculoskeletal ROS (+)   Abdominal   Peds negative pediatric ROS (+)  Hematology negative hematology ROS (+)   Anesthesia Other Findings   Reproductive/Obstetrics negative OB ROS                         Anesthesia Physical Anesthesia Plan  ASA: III  Anesthesia Plan: General   Post-op Pain Management:    Induction:   Airway Management Planned:   Additional Equipment:   Intra-op Plan:   Post-operative Plan:   Informed Consent: I have reviewed the patients History and Physical, chart, labs and discussed the procedure including the risks, benefits and alternatives for the proposed anesthesia with the patient or authorized representative who has indicated his/her understanding and acceptance.   Dental Advisory Given  Plan Discussed with: Anesthesiologist and CRNA  Anesthesia Plan Comments:        Anesthesia Quick Evaluation

## 2011-01-21 NOTE — Anesthesia Postprocedure Evaluation (Signed)
  Anesthesia Post-op Note  Patient: [name]  Procedure(s) Performed:  CARDIOVERSION - OUT PATIENT CARDIOVERSION  Patient Location: PACU and Short Stay  Anesthesia Type: General  Level of Consciousness: awake, alert  and oriented  Airway and Oxygen Therapy: Patient Spontanous Breathing  Post-op Pain: none  Post-op Assessment: Post-op Vital signs reviewed and Patient's Cardiovascular Status Stable  Post-op Vital Signs: Reviewed and stable  Complications: No apparent anesthesia complications

## 2011-01-21 NOTE — Op Note (Signed)
[name], [name]             ACCOUNT NO.:  [id number]  MEDICAL RECORD NO.:  [id number]  LOCATION:  [facility]                         FACILITY:  [facility]  PHYSICIAN:  Duke Salvia, MD, FACCDATE OF BIRTH:  Nov 09, 1923  DATE OF PROCEDURE:  01/21/2011 DATE OF DISCHARGE:  01/21/2011                              OPERATIVE REPORT   PREOPERATIVE DIAGNOSIS:  Atrial flutter.  POSTOPERATIVE DIAGNOSIS:  Sinus rhythm.  PROCEDURE:  DC cardioversion with pacemaker interrogation.  ANESTHESIA:  General.  DESCRIPTION OF PROCEDURE:  She received 100 mg of propofol.  A 100 joule shock was delivered in AP configuration.  Failing to terminate atrial tachycardia.  The patches were rearranged and she received 150 joule shock in a synchronized form, terminating atrial tachycardia/flutter and restoring sinus rhythm in A paced and V paced configuration.  The patient tolerated the procedure without apparent complication.     Duke Salvia, MD, [facility]     SCK/MEDQ  D:  01/21/2011  T:  01/21/2011  Job:  [id number]

## 2011-01-21 NOTE — H&P (Signed)
Mrs L is   HPI  [name] is a 77 y.o. female seen today for cardioversion for flecanide and sotalol refractory atrial arrhythmia with normal lv function  She has recurrenct diasystolic CHF, currently well complensated     Past Medical History  Diagnosis Date  . ABNORMAL ELECTROCARDIOGRAM   . ACID REFLUX DISEASE   . ANEMIA-NOS   . Atrial fibrillation   . BREAST CANCER   . CHF   . COPD   . DEPRESSION/ANXIETY   . DIVERTICULITIS, HX OF   . DYSGEUSIA   . Essential hypertension, benign   . FATIGUE   . GLAUCOMA   . Shortness of breath   . Pacemaker   . LYMPHEDEMA, RIGHT ARM     Past Surgical History  Procedure Date  . Mitral valve repair 2002  . Esophagogastrodenoscopy   . Partial colectomy   . Shoulder surgery   . Mastectomy     right breast     Current Facility-Administered Medications  Medication Dose Route Frequency Provider Last Rate Last Dose  . 0.9 %  sodium chloride infusion  250 mL Intravenous PRN Duke Salvia, MD      . hydrocortisone cream 1 % 1 application  1 application Topical TID PRN Duke Salvia, MD      . sodium chloride 0.9 % injection 3 mL  3 mL Intravenous Q12H Duke Salvia, MD      . sodium chloride 0.9 % injection 3 mL  3 mL Intravenous PRN Duke Salvia, MD       Facility-Administered Medications Ordered in Other Encounters  Medication Dose Route Frequency Provider Last Rate Last Dose  . 0.9 %  sodium chloride infusion    Continuous PRN Tabatha Berry        Allergies  Allergen Reactions  . Codeine     REACTION: Nausea and cold sweats  . Propafenone Hcl     REACTION: Reaction not known    Review of Systems negative except from HPI and PMH  Physical Exam Well developed and well nourished in no acute distress HENT normal E scleral and icterus clear Neck Supple JVP flat; carotids brisk and full Clear to ausculation Rapid but Regular rate and rhythm, no murmurs gallops or rub Soft with active bowel sounds No clubbing  cyanosis none Edema Alert and oriented, grossly normal motor and sensory function Skin Warm and Dry   Assessment and  Plan  Sflutter for dCCV today on amiodarone  INR 3.5

## 2011-01-21 NOTE — Brief Op Note (Signed)
01/21/2011  10:43 AM  PATIENT:  [name]  77 y.o. female  PRE-OPERATIVE DIAGNOSIS:  AFIB  POST-OPERATIVE DIAGNOSIS:  * No post-op diagnosis entered *  PROCEDURE:  Procedure(s): CARDIOVERSION  SURGEON:  Surgeon(s): Duke Salvia, MD  PHYSICIAN ASSISTANT:   ASSISTANTS: none   ANESTHESIA:   general  EBL:     BLOOD ADMINISTERED:none  DRAINS: none   LOCAL MEDICATIONS USED:  NONE    DICTATION: .Other Dictation: Dictation Number [id number]  PLAN OF CARE: Discharge to home after PACU  PATIENT DISPOSITION:  Short Stay   Delay start of Pharmacological VTE agent (>24hrs) due to surgical blood loss or risk of bleeding:  {YES/NO/NOT APPLICABLE:20182

## 2011-01-21 NOTE — Transfer of Care (Signed)
Immediate Anesthesia Transfer of Care Note  Patient: [name]  Procedure(s) Performed:  CARDIOVERSION - OUT PATIENT CARDIOVERSION  Patient Location: PACU and Short Stay  Anesthesia Type: General  Level of Consciousness: awake, alert  and oriented  Airway & Oxygen Therapy: Patient Spontanous Breathing and Patient connected to nasal cannula oxygen  Post-op Assessment: Report given to PACU RN and Post -op Vital signs reviewed and stable  Post vital signs: Reviewed and stable  Complications: No apparent anesthesia complications

## 2011-01-22 ENCOUNTER — Encounter (HOSPITAL_COMMUNITY): Payer: Self-pay | Admitting: Internal Medicine

## 2011-01-28 ENCOUNTER — Ambulatory Visit (INDEPENDENT_AMBULATORY_CARE_PROVIDER_SITE_OTHER): Payer: Medicare Other | Admitting: *Deleted

## 2011-01-28 DIAGNOSIS — I4891 Unspecified atrial fibrillation: Secondary | ICD-10-CM

## 2011-01-28 LAB — POCT INR: INR: 1.6

## 2011-01-31 ENCOUNTER — Encounter: Payer: Self-pay | Admitting: Internal Medicine

## 2011-02-15 ENCOUNTER — Ambulatory Visit (INDEPENDENT_AMBULATORY_CARE_PROVIDER_SITE_OTHER): Payer: Medicare Other | Admitting: Internal Medicine

## 2011-02-15 ENCOUNTER — Ambulatory Visit (INDEPENDENT_AMBULATORY_CARE_PROVIDER_SITE_OTHER): Payer: Medicare Other | Admitting: *Deleted

## 2011-02-15 ENCOUNTER — Encounter: Payer: Self-pay | Admitting: Internal Medicine

## 2011-02-15 DIAGNOSIS — F341 Dysthymic disorder: Secondary | ICD-10-CM

## 2011-02-15 DIAGNOSIS — I4891 Unspecified atrial fibrillation: Secondary | ICD-10-CM

## 2011-02-15 DIAGNOSIS — I10 Essential (primary) hypertension: Secondary | ICD-10-CM

## 2011-02-15 DIAGNOSIS — Z950 Presence of cardiac pacemaker: Secondary | ICD-10-CM

## 2011-02-15 DIAGNOSIS — I509 Heart failure, unspecified: Secondary | ICD-10-CM

## 2011-02-15 LAB — PACEMAKER DEVICE OBSERVATION
AL AMPLITUDE: 4.3 mv
BAMS-0001: 150 {beats}/min
BATTERY VOLTAGE: 2.9629 V
VENTRICULAR PACING PM: 51

## 2011-02-15 MED ORDER — AMIODARONE HCL 200 MG PO TABS: 200.0000 mg | ORAL_TABLET | Freq: Every day | ORAL | Status: DC

## 2011-02-15 NOTE — Assessment & Plan Note (Signed)
The patient is holding sinus rhythm on amiodarone. We will decrease her dose from 400-200 mg a day at the end of his current prescription.  We will check her amiodarone surveillance laboratories when she comes into the Coumadin clinic in 3 weeks. She is advised about the interaction between the 2 drugs and the need for close monitoring of her Coumadin following down titration of her amiodarone

## 2011-02-15 NOTE — Progress Notes (Signed)
HPI  [name] is a 78 y.o. female Seen in followup for recurrence of atrial arrhythmias. 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 We recently attempted cardioversion in the context of flecainide therapy; unfortunately she has failed and has reverted to atrial arrhythmia. She is also failed sotalol. Recently initiated amiodarone therapy and electrocardioversion early December 2012  She feels anxious depressed. She saw a physician who gave effexor which she did not tolerate.  She is looking for another cardiologist    She is feeling less SOB and perhaps with more energy    Past Medical History  Diagnosis Date  . ABNORMAL ELECTROCARDIOGRAM   . ACID REFLUX DISEASE   . ANEMIA-NOS   . Campath-induced atrial fibrillation   . BREAST CANCER   . CHF   . COPD   . DEPRESSION/ANXIETY   . DIVERTICULITIS, HX OF   . DYSGEUSIA   . Essential hypertension, benign   . FATIGUE   . GLAUCOMA   . Shortness of breath   . Pacemaker   . LYMPHEDEMA, RIGHT ARM     Past Surgical History  Procedure Date  . Mitral valve repair 2002  . Esophagogastrodenoscopy   . Partial colectomy   . Shoulder surgery   . Mastectomy     right breast   . Cardioversion 01/21/2011    Procedure: CARDIOVERSION;  Surgeon: Duke Salvia, MD;  Location: [facility] OR;  Service: Cardiovascular;  Laterality: N/A;  OUT PATIENT CARDIOVERSION    Current Outpatient Prescriptions  Medication Sig Dispense Refill  . amiodarone (PACERONE) 400 MG tablet Take 400 mg by mouth daily.       . bisacodyl (DULCOLAX) 5 MG EC tablet Take 20 mg by mouth every other day.        . brimonidine (ALPHAGAN) 0.15 % ophthalmic solution Place 1 drop into both eyes 2 (two) times daily.        . clonazePAM (KLONOPIN) 1 MG tablet Take 1 mg by mouth daily as needed. For anxiety      . ibuprofen (ADVIL,MOTRIN) 200 MG tablet Take 400 mg by mouth every 6 (six) hours as needed. For pain.       . mometasone (NASONEX)  50 MCG/ACT nasal spray Place 2 sprays into the nose daily.        . timolol (BETIMOL) 0.5 % ophthalmic solution Place 1 drop into both eyes daily.       Marland Kitchen tiotropium (SPIRIVA) 18 MCG inhalation capsule Place 18 mcg into inhaler and inhale as directed.        . vitamin B-12 (CYANOCOBALAMIN) 1000 MCG tablet Take 1,000 mcg by mouth daily.       Marland Kitchen warfarin (COUMADIN) 5 MG tablet Take 2.5 mg by mouth daily.        Marland Kitchen zolpidem (AMBIEN) 10 MG tablet Take 10 mg by mouth at bedtime as needed. For sleep        Allergies  Allergen Reactions  . Codeine     REACTION: Nausea and cold sweats  . Propafenone Hcl     REACTION: Reaction not known  BP 147/89  Pulse 72  Ht 5\' 4"  (1.626 m)  Wt 124 lb (56.246 kg)  BMI 21.28 kg/m2   Review of Systems negative except from HPI and PMH  Physical Exam Well developed and well nourished in no acute distress HENT normal E scleral and icterus clear Neck Supple JVP flat; carotids brisk and full Clear to ausculation Regular rate and  rhythm, no murmurs gallops or rub Soft with active bowel sounds No clubbing cyanosis none Edema Alert and oriented, grossly normal motor and sensory function Skin Warm and Dry   Assessment and  Plan

## 2011-02-15 NOTE — Assessment & Plan Note (Signed)
Blood pressure is reasonably controlled. 

## 2011-02-15 NOTE — Assessment & Plan Note (Signed)
The patient's device was interrogated.  The information was reviewed. No changes were made in the programming.    

## 2011-02-15 NOTE — Assessment & Plan Note (Signed)
She is quite depressed and asks again for the name of gerizric psychiatry  I will try and get a contact

## 2011-02-15 NOTE — Patient Instructions (Addendum)
Your physician recommends that you return for lab work in: 3 weeks- tsh/liver  Your physician has recommended you make the following change in your medication:  1) Decrease amiodarone to 200mg  once daily.  Remote monitoring is used to monitor your Pacemaker of ICD from home. This monitoring reduces the number of office visits required to check your device to one time per year. It allows [place] to keep an eye on the functioning of your device to ensure it is working properly. You are scheduled for a device check from home on 05/16/11. You may send your transmission at any time that day. If you have a wireless device, the transmission will be sent automatically. After your physician reviews your transmission, you will receive a postcard with your next transmission date.  Your physician wants you to follow-up in: 1 year with Dr. Graciela Husbands. You will receive a reminder letter in the mail two months in advance. If you don't receive a letter, please call our office to schedule the follow-up appointment.  The name and address of the psychiatrist that Dr. Graciela Husbands recommended is: [facility] [address] [place], [place] [address] [phone]

## 2011-02-15 NOTE — Assessment & Plan Note (Signed)
Heart failure is better in the absence of atrial fibrillation

## 2011-02-18 ENCOUNTER — Encounter: Payer: Self-pay | Admitting: Internal Medicine

## 2011-02-18 NOTE — Telephone Encounter (Signed)
error 

## 2011-02-19 ENCOUNTER — Telehealth: Payer: Self-pay | Admitting: Internal Medicine

## 2011-02-19 NOTE — Telephone Encounter (Signed)
I spoke with the patient and gave her the contact information for Dr. Donell Beers on [address] I have notified her as well, that I mailed this information to her after her office visit last week, so it should be coming in her mail.

## 2011-02-19 NOTE — Telephone Encounter (Signed)
PT CALLING RE BEING REF TO ANOTHER DR FOR PSYCHIATRY AND HAS NOT HEARD FROM DR Graciela Husbands RE THIS

## 2011-03-08 ENCOUNTER — Other Ambulatory Visit: Payer: Medicare Other | Admitting: *Deleted

## 2011-03-08 ENCOUNTER — Ambulatory Visit (INDEPENDENT_AMBULATORY_CARE_PROVIDER_SITE_OTHER): Payer: Medicare Other | Admitting: *Deleted

## 2011-03-08 DIAGNOSIS — I4891 Unspecified atrial fibrillation: Secondary | ICD-10-CM

## 2011-03-20 ENCOUNTER — Telehealth: Payer: Self-pay | Admitting: Internal Medicine

## 2011-03-22 ENCOUNTER — Ambulatory Visit (INDEPENDENT_AMBULATORY_CARE_PROVIDER_SITE_OTHER): Payer: Medicare Other | Admitting: *Deleted

## 2011-03-22 ENCOUNTER — Other Ambulatory Visit (INDEPENDENT_AMBULATORY_CARE_PROVIDER_SITE_OTHER): Payer: Medicare Other | Admitting: *Deleted

## 2011-03-22 DIAGNOSIS — Z950 Presence of cardiac pacemaker: Secondary | ICD-10-CM

## 2011-03-22 DIAGNOSIS — I10 Essential (primary) hypertension: Secondary | ICD-10-CM

## 2011-03-22 DIAGNOSIS — F341 Dysthymic disorder: Secondary | ICD-10-CM

## 2011-03-22 DIAGNOSIS — I4891 Unspecified atrial fibrillation: Secondary | ICD-10-CM

## 2011-03-22 DIAGNOSIS — I509 Heart failure, unspecified: Secondary | ICD-10-CM

## 2011-03-22 LAB — HEPATIC FUNCTION PANEL
ALT: 34 U/L (ref 0–35)
Bilirubin, Direct: 0.1 mg/dL (ref 0.0–0.3)
Total Bilirubin: 0.7 mg/dL (ref 0.3–1.2)

## 2011-03-26 ENCOUNTER — Telehealth: Payer: Self-pay | Admitting: Internal Medicine

## 2011-03-26 NOTE — Telephone Encounter (Signed)
New Problem   Patient returning nurse Debbie call, she can reached at hm# [phone]

## 2011-03-26 NOTE — Telephone Encounter (Signed)
Opened in error

## 2011-03-26 NOTE — Telephone Encounter (Signed)
I spoke with the patient. 

## 2011-04-01 ENCOUNTER — Ambulatory Visit (INDEPENDENT_AMBULATORY_CARE_PROVIDER_SITE_OTHER): Payer: Medicare Other | Admitting: Internal Medicine

## 2011-04-01 ENCOUNTER — Encounter: Payer: Self-pay | Admitting: Internal Medicine

## 2011-04-01 ENCOUNTER — Telehealth: Payer: Self-pay | Admitting: Internal Medicine

## 2011-04-01 VITALS — BP 128/84 | HR 109 | Temp 97.5°F | Ht 60.0 in | Wt 125.0 lb

## 2011-04-01 DIAGNOSIS — I10 Essential (primary) hypertension: Secondary | ICD-10-CM

## 2011-04-01 DIAGNOSIS — F341 Dysthymic disorder: Secondary | ICD-10-CM

## 2011-04-01 DIAGNOSIS — I4891 Unspecified atrial fibrillation: Secondary | ICD-10-CM

## 2011-04-01 MED ORDER — SERTRALINE HCL 25 MG PO TABS: ORAL_TABLET | ORAL | Status: DC

## 2011-04-01 NOTE — Assessment & Plan Note (Signed)
Management as per cardiology 

## 2011-04-01 NOTE — Assessment & Plan Note (Addendum)
78 year old with history of depression/anxiety worsened by loss of independence / life changes.  Trial of sertraline.  We discussed that it may take 4-6 weeks for her to notice any change. Patient urged to stay on her medication until next office visit. Arrange counseling with Judithe Modest.  Recent TSH normal.

## 2011-04-01 NOTE — Progress Notes (Signed)
Subjective:    Patient ID: [name], female    DOB: 04-19-1923, 78 y.o.   MRN: [id number]  HPI 78 year old white female with history of atrial fibrillation, breast cancer, depression and anxiety to establish. Her previous primary care physician was Dr. Leonette Most in [facility]. Her children recently moved her to [place].   She is here to establish new primary care.  Patient has been working with psychiatrist (Dr. Dub Mikes) over the last year re: worsening depression. She has history of mood disorder over several years. Her previous PCP had tried patient on Paxil and Lexapro. She does not recall how long she took these medications. She was most recently tried on Effexor but had to discontinue due to unwanted side effects.  She had been a widow for the last 8 years. She attributes her worsening mood do to loneliness and loss of independence. She had to give up her driving privileges within the last one year. She currently lives in a high-rise building. She is not happy with her current living situation. She has limited social support.   Review of Systems  Constitutional: Negative for activity change, appetite change and unexpected weight change.  Eyes: Negative for visual disturbance.  Respiratory: Negative for cough, chest tightness and shortness of breath.   Cardiovascular: Negative for chest pain.  Genitourinary: Negative for difficulty urinating.  Neurological: Negative for headaches.  Gastrointestinal: Negative for abdominal pain, heartburn melena or hematochezia Psych: Positive for depression Endo:  She is on amiodarone but her recent TSH was normal  Past Medical History  Diagnosis Date  . ABNORMAL ELECTROCARDIOGRAM   . ACID REFLUX DISEASE   . ANEMIA-NOS   . Atrial fibrillation   . BREAST CANCER   . CHF   . COPD   . DEPRESSION/ANXIETY   . DIVERTICULITIS, HX OF   . DYSGEUSIA   . Essential hypertension, benign   . FATIGUE   . GLAUCOMA   . Shortness of breath   . Pacemaker     . LYMPHEDEMA, RIGHT ARM     History   Social History  . Marital Status: Widowed    Spouse Name: N/A    Number of Children: N/A  . Years of Education: N/A   Occupational History  . Not on file.   Social History Main Topics  . Smoking status: Never Smoker   . Smokeless tobacco: Never Used  . Alcohol Use: 2.0 oz/week    4 drink(s) per week     glass of wine every other day  . Drug Use: No  . Sexually Active: Not on file   Other Topics Concern  . Not on file   Social History Narrative  . No narrative on file    Past Surgical History  Procedure Date  . Mitral valve repair 2002  . Esophagogastrodenoscopy   . Partial colectomy   . Shoulder surgery   . Mastectomy     right breast   . Cardioversion 01/21/2011    Procedure: CARDIOVERSION;  Surgeon: Duke Salvia, MD;  Location: [facility] OR;  Service: Cardiovascular;  Laterality: N/A;  OUT PATIENT CARDIOVERSION    Family History  Problem Relation Age of Onset  . Other Mother 73    Died from old age  . Other Father 51    car accident  . Uterine cancer Sister 26    Allergies  Allergen Reactions  . Codeine     REACTION: Nausea and cold sweats  . Propafenone Hcl     REACTION: Reaction  not known    Current Outpatient Prescriptions on File Prior to Visit  Medication Sig Dispense Refill  . amiodarone (PACERONE) 200 MG tablet Take 1 tablet (200 mg total) by mouth daily.  30 tablet  6  . bisacodyl (DULCOLAX) 5 MG EC tablet Take 20 mg by mouth every other day.        . brimonidine (ALPHAGAN) 0.15 % ophthalmic solution Place 1 drop into both eyes 2 (two) times daily.        . clonazePAM (KLONOPIN) 1 MG tablet Take 1 mg by mouth daily as needed. For anxiety      . ibuprofen (ADVIL,MOTRIN) 200 MG tablet Take 400 mg by mouth every 6 (six) hours as needed. For pain.       . mometasone (NASONEX) 50 MCG/ACT nasal spray Place 2 sprays into the nose daily.        . timolol (BETIMOL) 0.5 % ophthalmic solution Place 1 drop into both  eyes daily.       Marland Kitchen tiotropium (SPIRIVA) 18 MCG inhalation capsule Place 18 mcg into inhaler and inhale as directed.        . vitamin B-12 (CYANOCOBALAMIN) 1000 MCG tablet Take 1,000 mcg by mouth daily.       Marland Kitchen warfarin (COUMADIN) 5 MG tablet Take 2.5 mg by mouth daily.        Marland Kitchen zolpidem (AMBIEN) 10 MG tablet Take 10 mg by mouth at bedtime as needed. For sleep        BP 128/84  Pulse 109  Temp(Src) 97.5 F (36.4 C) (Oral)  Ht 5' (1.524 m)  Wt 125 lb (56.7 kg)  BMI 24.41 kg/m2  SpO2 96%    Objective:   Physical Exam  Constitutional: She is oriented to person, place, and time. She appears well-developed and well-nourished. No distress.  HENT:  Head: Normocephalic and atraumatic.  Right Ear: External ear normal.  Left Ear: External ear normal.       Previous left and right iridectomy  Neck: Neck supple.  Cardiovascular: Normal rate and regular rhythm.   Pulmonary/Chest: Effort normal and breath sounds normal. She has no wheezes. She has no rales.  Abdominal: Soft. Bowel sounds are normal.  Musculoskeletal: She exhibits no edema.  Lymphadenopathy:    She has no cervical adenopathy.  Neurological: She is alert and oriented to person, place, and time. No cranial nerve deficit.  Skin: Skin is warm and dry.  Psychiatric: She has a normal mood and affect. Her behavior is normal.      Assessment & Plan:

## 2011-04-01 NOTE — Assessment & Plan Note (Signed)
Stable.  Her heart rate is elevated.  She plans to contact her cardiologist and follow up re: tachycardia.

## 2011-04-01 NOTE — Telephone Encounter (Signed)
Pt has an elevated HR no other symptoms and she wanted to talk to someone

## 2011-04-01 NOTE — Telephone Encounter (Signed)
Patient states was on her PCP today and was told that her heart rate was 107 to 108 beats/ minute no other symptoms.She said yesterday also her heart was rapid also. She   does not know if it was irregular or not . Her B/P was fine. Pt states has had too may cardio versions and she is apprehensive. She states will call tomorrow to speak with Herbert Seta to see if she needs an EKG if her HR continues to be rapid.

## 2011-04-02 NOTE — Telephone Encounter (Signed)
I spoke with the patient this morning. She states that she feels ok. She did notice on Saturday when riding her bike, that her pulse was 107. She went to her PCP yesterday and was told her pulse was 108. She was not told if this is irregular or not. I explained we could bring her in for an EKG. She has not hooked up her Merlin box. I have instructed her to call the company with questions. She states she will see how she feels and call us back later this week if she thinks she needs to be checked.

## 2011-04-10 ENCOUNTER — Ambulatory Visit (INDEPENDENT_AMBULATORY_CARE_PROVIDER_SITE_OTHER): Payer: Medicare Other | Admitting: *Deleted

## 2011-04-10 ENCOUNTER — Ambulatory Visit (INDEPENDENT_AMBULATORY_CARE_PROVIDER_SITE_OTHER): Payer: Medicare Other | Admitting: Pharmacist

## 2011-04-10 ENCOUNTER — Encounter: Payer: Self-pay | Admitting: Internal Medicine

## 2011-04-10 DIAGNOSIS — I4891 Unspecified atrial fibrillation: Secondary | ICD-10-CM

## 2011-04-10 DIAGNOSIS — Z7901 Long term (current) use of anticoagulants: Secondary | ICD-10-CM

## 2011-04-10 DIAGNOSIS — I498 Other specified cardiac arrhythmias: Secondary | ICD-10-CM

## 2011-04-10 LAB — PACEMAKER DEVICE OBSERVATION
AL THRESHOLD: 0.75 V
BAMS-0001: 150 {beats}/min
BATTERY VOLTAGE: 2.9629 V
RV LEAD AMPLITUDE: 12 mv

## 2011-04-10 LAB — POCT INR: INR: 2

## 2011-04-10 NOTE — Progress Notes (Signed)
PPM check 

## 2011-04-18 ENCOUNTER — Ambulatory Visit (INDEPENDENT_AMBULATORY_CARE_PROVIDER_SITE_OTHER): Payer: Medicare Other | Admitting: Internal Medicine

## 2011-04-18 ENCOUNTER — Encounter: Payer: Self-pay | Admitting: Internal Medicine

## 2011-04-18 ENCOUNTER — Ambulatory Visit (INDEPENDENT_AMBULATORY_CARE_PROVIDER_SITE_OTHER): Payer: Medicare Other | Admitting: Cardiology

## 2011-04-18 DIAGNOSIS — Z7901 Long term (current) use of anticoagulants: Secondary | ICD-10-CM

## 2011-04-18 DIAGNOSIS — I4891 Unspecified atrial fibrillation: Secondary | ICD-10-CM

## 2011-04-18 DIAGNOSIS — I509 Heart failure, unspecified: Secondary | ICD-10-CM

## 2011-04-18 DIAGNOSIS — Z950 Presence of cardiac pacemaker: Secondary | ICD-10-CM

## 2011-04-18 LAB — POCT INR: INR: 2.4

## 2011-04-18 NOTE — Assessment & Plan Note (Signed)
Aggravated by her aflutter

## 2011-04-18 NOTE — Assessment & Plan Note (Addendum)
It may be that her aflutt is consequence of amio plus afib and may be isthmus dependent  For now will plan to dccv after 3 weeks  INR>2 last week  And if recurs and typical consider RFCA as alternative nothwithstanding age

## 2011-04-18 NOTE — Progress Notes (Signed)
HPI  [name] is a 78 y.o. female Seen in followup for recurrence of atrial arrhythmias. 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 We recently attempted cardioversion in the context of flecainide therapy; unfortunately she has failed and has reverted to atrial arrhythmia. She is also failed sotalol. Recently initiated amiodarone therapy and electrocardioversion early December 2012 and at last visit >>200  She has noted fatigue and was told HR was faster  She was found to be in aflut 2:!    She conitnues to work on treatment for her depression    She is feeling less SOB and perhaps with more energy    Past Medical History  Diagnosis Date  . ABNORMAL ELECTROCARDIOGRAM   . ACID REFLUX DISEASE   . ANEMIA-NOS   . Atrial fibrillation   . BREAST CANCER   . CHF   . COPD   . DEPRESSION/ANXIETY   . DIVERTICULITIS, HX OF   . DYSGEUSIA   . Essential hypertension, benign   . FATIGUE   . GLAUCOMA   . Shortness of breath   . Pacemaker   . LYMPHEDEMA, RIGHT ARM     Past Surgical History  Procedure Date  . Mitral valve repair 2002  . Esophagogastrodenoscopy   . Partial colectomy   . Shoulder surgery   . Mastectomy     right breast   . Cardioversion 01/21/2011    Procedure: CARDIOVERSION;  Surgeon: Duke Salvia, MD;  Location: [facility] OR;  Service: Cardiovascular;  Laterality: N/A;  OUT PATIENT CARDIOVERSION    Current Outpatient Prescriptions  Medication Sig Dispense Refill  . amiodarone (PACERONE) 200 MG tablet Take 1 tablet (200 mg total) by mouth daily.  30 tablet  6  . bisacodyl (DULCOLAX) 5 MG EC tablet Take 20 mg by mouth every other day.        . brimonidine (ALPHAGAN) 0.15 % ophthalmic solution Place 1 drop into both eyes 2 (two) times daily.        . clonazePAM (KLONOPIN) 1 MG tablet Take 1 mg by mouth daily as needed. For anxiety      . escitalopram (LEXAPRO) 5 MG tablet Take 5 mg by mouth daily.      Marland Kitchen ibuprofen  (ADVIL,MOTRIN) 200 MG tablet Take 400 mg by mouth every 6 (six) hours as needed. For pain.       . mometasone (NASONEX) 50 MCG/ACT nasal spray Place 2 sprays into the nose daily.        . timolol (BETIMOL) 0.5 % ophthalmic solution Place 1 drop into both eyes daily.       Marland Kitchen tiotropium (SPIRIVA) 18 MCG inhalation capsule Place 18 mcg into inhaler and inhale as directed.        . vitamin B-12 (CYANOCOBALAMIN) 1000 MCG tablet Take 1,000 mcg by mouth daily.       Marland Kitchen warfarin (COUMADIN) 5 MG tablet Take 2.5 mg by mouth daily.        Marland Kitchen zolpidem (AMBIEN) 10 MG tablet Take 10 mg by mouth at bedtime as needed. For sleep        Allergies  Allergen Reactions  . Codeine     REACTION: Nausea and cold sweats  . Propafenone Hcl     REACTION: Reaction not known  BP 120/86  Pulse 90  Wt 124 lb 3.2 oz (56.337 kg)   Review of Systems negative except from HPI and PMH  Physical Exam Well developed and well nourished in  no acute distress HENT normal E scleral and icterus clear Neck Supple Clear to ausculation Rapid regular rhythm, no murmurs gallops or rub Soft with active bowel sounds No clubbing cyanosis none Edema Alert and oriented, grossly normal motor and sensory function Skin Warm and Dry  Device interrogation aflutter with 2:1 Assessment and  Plan

## 2011-04-18 NOTE — Assessment & Plan Note (Signed)
stable °

## 2011-04-18 NOTE — Patient Instructions (Addendum)
You will need weekly coumadin checks for the next 2 weeks. If your levels stay between 2.0- 3.0 we will schedule you for a cardioversion in about 2 1/2 weeks.  Your physician recommends that you continue on your current medications as directed. Please refer to the Current Medication list given to you today.

## 2011-04-22 ENCOUNTER — Telehealth: Payer: Self-pay | Admitting: Internal Medicine

## 2011-04-22 NOTE — Telephone Encounter (Signed)
I spoke with the patient and made her aware if her coumadin levels are ok this week and next, we would possibly be looking at 3/27 with Dr. Graciela Husbands for her DCCV.

## 2011-04-22 NOTE — Telephone Encounter (Signed)
New msg Pt wants to know when Dr Graciela Husbands does cardioversions. Please call her back

## 2011-04-24 ENCOUNTER — Ambulatory Visit (INDEPENDENT_AMBULATORY_CARE_PROVIDER_SITE_OTHER): Payer: Medicare Other | Admitting: *Deleted

## 2011-04-24 DIAGNOSIS — Z7901 Long term (current) use of anticoagulants: Secondary | ICD-10-CM

## 2011-04-24 DIAGNOSIS — I4891 Unspecified atrial fibrillation: Secondary | ICD-10-CM

## 2011-04-29 ENCOUNTER — Ambulatory Visit: Payer: Medicare Other | Admitting: Internal Medicine

## 2011-05-02 ENCOUNTER — Ambulatory Visit (INDEPENDENT_AMBULATORY_CARE_PROVIDER_SITE_OTHER): Payer: Medicare Other | Admitting: *Deleted

## 2011-05-02 DIAGNOSIS — I4891 Unspecified atrial fibrillation: Secondary | ICD-10-CM

## 2011-05-02 DIAGNOSIS — Z7901 Long term (current) use of anticoagulants: Secondary | ICD-10-CM

## 2011-05-03 ENCOUNTER — Encounter: Payer: Self-pay | Admitting: *Deleted

## 2011-05-03 ENCOUNTER — Telehealth: Payer: Self-pay | Admitting: Internal Medicine

## 2011-05-03 NOTE — Telephone Encounter (Signed)
I spoke with the patient. She has been scheduled for her DCCV on 05/16/11 per her request. I made her aware I will mail her instructions to her at [address], [place], [place] [address].

## 2011-05-03 NOTE — Telephone Encounter (Signed)
Pt was to call heather this pm

## 2011-05-06 ENCOUNTER — Other Ambulatory Visit: Payer: Self-pay | Admitting: Internal Medicine

## 2011-05-06 DIAGNOSIS — I4891 Unspecified atrial fibrillation: Secondary | ICD-10-CM

## 2011-05-08 ENCOUNTER — Ambulatory Visit (INDEPENDENT_AMBULATORY_CARE_PROVIDER_SITE_OTHER): Payer: Medicare Other | Admitting: *Deleted

## 2011-05-08 DIAGNOSIS — I4891 Unspecified atrial fibrillation: Secondary | ICD-10-CM

## 2011-05-08 DIAGNOSIS — Z7901 Long term (current) use of anticoagulants: Secondary | ICD-10-CM

## 2011-05-08 LAB — POCT INR: INR: 3.6

## 2011-05-10 ENCOUNTER — Encounter (HOSPITAL_COMMUNITY): Payer: Self-pay | Admitting: Pharmacy Technician

## 2011-05-15 ENCOUNTER — Ambulatory Visit (INDEPENDENT_AMBULATORY_CARE_PROVIDER_SITE_OTHER): Payer: Medicare Other | Admitting: *Deleted

## 2011-05-15 DIAGNOSIS — I4891 Unspecified atrial fibrillation: Secondary | ICD-10-CM

## 2011-05-15 DIAGNOSIS — Z7901 Long term (current) use of anticoagulants: Secondary | ICD-10-CM

## 2011-05-15 MED ORDER — SODIUM CHLORIDE 0.9 % IV SOLN: INTRAVENOUS | Status: DC

## 2011-05-16 ENCOUNTER — Ambulatory Visit (HOSPITAL_COMMUNITY): Payer: Medicare Other | Admitting: Certified Registered"

## 2011-05-16 ENCOUNTER — Other Ambulatory Visit: Payer: Self-pay

## 2011-05-16 ENCOUNTER — Encounter (HOSPITAL_COMMUNITY)
Admission: RE | Disposition: A | Discharge status: Home / Self Care | Payer: Self-pay | Source: Ambulatory Visit | Attending: Internal Medicine

## 2011-05-16 ENCOUNTER — Ambulatory Visit (HOSPITAL_COMMUNITY)
Admission: RE | Admit: 2011-05-16 | Discharge: 2011-05-16 | Disposition: A | Discharge status: Home / Self Care | Payer: Medicare Other | Source: Ambulatory Visit | Attending: Internal Medicine | Admitting: Internal Medicine

## 2011-05-16 ENCOUNTER — Encounter (HOSPITAL_COMMUNITY): Payer: Self-pay | Admitting: Anesthesiology

## 2011-05-16 ENCOUNTER — Encounter (HOSPITAL_COMMUNITY): Payer: Self-pay | Admitting: Certified Registered"

## 2011-05-16 ENCOUNTER — Encounter: Payer: Medicare Other | Admitting: *Deleted

## 2011-05-16 ENCOUNTER — Encounter (HOSPITAL_COMMUNITY): Payer: Self-pay | Admitting: *Deleted

## 2011-05-16 DIAGNOSIS — I4892 Unspecified atrial flutter: Secondary | ICD-10-CM | POA: Insufficient documentation

## 2011-05-16 DIAGNOSIS — I4891 Unspecified atrial fibrillation: Secondary | ICD-10-CM

## 2011-05-16 HISTORY — PX: CARDIOVERSION: SHX1299

## 2011-05-16 LAB — PROTIME-INR
INR: 2.24 — ABNORMAL HIGH (ref 0.00–1.49)
Prothrombin Time: 25.2 seconds — ABNORMAL HIGH (ref 11.6–15.2)

## 2011-05-16 LAB — BASIC METABOLIC PANEL
CO2: 24 mEq/L (ref 19–32)
Chloride: 102 mEq/L (ref 96–112)
GFR calc Af Amer: >90 mL/min (ref >90)
Potassium: 4.3 mEq/L (ref 3.5–5.1)
Sodium: 137 mEq/L (ref 135–145)

## 2011-05-16 LAB — APTT: aPTT: 40 seconds — ABNORMAL HIGH (ref 24–37)

## 2011-05-16 LAB — CBC
Platelets: 187 10*3/uL (ref 150–400)
RBC: 4.95 MIL/uL (ref 3.87–5.11)
RDW: 14.1 % (ref 11.5–15.5)
WBC: 5.5 10*3/uL (ref 4.0–10.5)

## 2011-05-16 SURGERY — CARDIOVERSION
Anesthesia: General | Wound class: Clean

## 2011-05-16 MED ORDER — SODIUM CHLORIDE 0.9 % IJ SOLN: 3.0000 mL | INTRAMUSCULAR | Status: DC | PRN

## 2011-05-16 MED ORDER — HYDROMORPHONE HCL PF 1 MG/ML IJ SOLN: 0.2500 mg | INTRAMUSCULAR | Status: DC | PRN

## 2011-05-16 MED ORDER — PROPOFOL 10 MG/ML IV EMUL
INTRAVENOUS | Status: DC | PRN
  Administered 2011-05-16: 40 mg via INTRAVENOUS

## 2011-05-16 MED ORDER — ONDANSETRON HCL 4 MG/2ML IJ SOLN: 4.0000 mg | Freq: Once | INTRAMUSCULAR | Status: DC | PRN

## 2011-05-16 MED ORDER — SODIUM CHLORIDE 0.9 % IJ SOLN: 3.0000 mL | Freq: Two times a day (BID) | INTRAMUSCULAR | Status: DC

## 2011-05-16 MED ORDER — HYDROCORTISONE 1 % EX CREA: 1.0000 "application " | TOPICAL_CREAM | Freq: Three times a day (TID) | CUTANEOUS | Status: DC | PRN

## 2011-05-16 MED ORDER — SODIUM CHLORIDE 0.9 % IV SOLN: 250.0000 mL | INTRAVENOUS | Status: DC

## 2011-05-16 MED ORDER — LIDOCAINE HCL (CARDIAC) 20 MG/ML IV SOLN
INTRAVENOUS | Status: DC | PRN
  Administered 2011-05-16: 40 mg via INTRAVENOUS

## 2011-05-16 MED ORDER — SODIUM CHLORIDE 0.9 % IV SOLN
INTRAVENOUS | Status: DC | PRN
  Administered 2011-05-16: 10:00:00 via INTRAVENOUS

## 2011-05-16 NOTE — Anesthesia Postprocedure Evaluation (Signed)
  Anesthesia Post-op Note  Patient: [name]  Procedure(s) Performed: Procedure(s) (LRB): CARDIOVERSION (N/A)  Patient Location: PACU and Short Stay  Anesthesia Type: General  Level of Consciousness: awake  Airway and Oxygen Therapy: Patient Spontanous Breathing  Post-op Pain: none  Post-op Assessment: Post-op Vital signs reviewed  Post-op Vital Signs: stable  Complications: No apparent anesthesia complications

## 2011-05-16 NOTE — Transfer of Care (Signed)
Immediate Anesthesia Transfer of Care Note  Patient: [name]  Procedure(s) Performed: Procedure(s) (LRB): CARDIOVERSION (N/A)  Patient Location: PACU and Short Stay  Anesthesia Type: General  Level of Consciousness: awake  Airway & Oxygen Therapy: Patient Spontanous Breathing  Post-op Assessment: Post -op Vital signs reviewed and stable  Post vital signs: stable  Complications: No apparent anesthesia complications

## 2011-05-16 NOTE — Discharge Instructions (Signed)
Electrical Cardioversion Cardioversion is the delivery of a jolt of electricity to change the rhythm of the heart. Sticky patches or metal paddles are placed on the chest to deliver the electricity from a special device. This is done to restore a normal rhythm. A rhythm that is too fast or not regular keeps the heart from pumping well. Compared to medicines used to change an abnormal rhythm, cardioversion is faster and works better. It is also unpleasant and may dislodge blood clots from the heart. WHEN WOULD THIS BE DONE?  In an emergency:   There is low or no blood pressure as a result of the heart rhythm.   Normal rhythm must be restored as fast as possible to protect the brain and heart from further damage.   It may save a life.   For less serious heart rhythms, such as atrial fibrillation or flutter, in which:   The heart is beating too fast or is not regular.   The heart is still able to pump enough blood, but not as well as it should.   Medicine to change the rhythm has not worked.   It is safe to wait in order to allow time for preparation.  LET YOUR CAREGIVER KNOW ABOUT:   Every medicine you are taking. It is very important to do this! Know when to take or stop taking any of them.   Any time in the past that you have felt your heart was not beating normally.  RISKS AND COMPLICATIONS   Clots may form in the chambers of the heart if it is beating too fast. These clots may be dislodged during the procedure and travel to other parts of the body.   There is risk of a stroke during and after the procedure if a clot moves. Blood thinners lower this risk.   You may have a special test of your heart (TEE) to make sure there are no clots in your heart.  BEFORE THE PROCEDURE   You may have some tests to see how well your heart is working.   You may start taking blood thinners so your blood does not clot as easily.   Other drugs may be given to help your heart work better.   PROCEDURE (SCHEDULED)  The procedure is typically done in a hospital by a heart doctor (cardiologist).   You will be told when and where to go.   You may be given some medicine through an intravenous (IV) access to reduce discomfort and make you sleepy before the procedure.   Your whole body may move when the shock is delivered. Your chest may feel sore.   You may be able to go home after a few hours. Your heart rhythm will be watched to make sure it does not change.  HOME CARE INSTRUCTIONS   Only take medicine as directed by your caregiver. Be sure you understand how and when to take your medicine.   Learn how to feel your pulse and check it often.   Limit your activity for 48 hours.   Avoid caffeine and other stimulants as directed.  SEEK MEDICAL CARE IF:   You feel like your heart is beating too fast or your pulse is not regular.   You have any questions about your medicines.   You have bleeding that will not stop.  SEEK IMMEDIATE MEDICAL CARE IF:   You are dizzy or feel faint.   It is hard to breathe or you feel short of breath.     There is a change in discomfort in your chest.   Your speech is slurred or you have trouble moving your arm or leg on one side.   You get a muscle cramp.   Your fingers or toes turn cold or blue.  MAKE SURE YOU:   Understand these instructions.   Will watch your condition.   Will get help right away if you are not doing well or get worse.  Document Released: 01/18/2002 Document Revised: 01/17/2011 Document Reviewed: 05/20/2007 [employer] Patient Information 2012 [employer]. 

## 2011-05-16 NOTE — H&P (View-Only) (Signed)
HPI  [name] is a 77 y.o. female Seen in followup for recurrence of atrial arrhythmias. 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 We recently attempted cardioversion in the context of flecainide therapy; unfortunately she has failed and has reverted to atrial arrhythmia. She is also failed sotalol. Recently initiated amiodarone therapy and electrocardioversion early December 2012 and at last visit >>200  She has noted fatigue and was told HR was faster  She was found to be in aflut 2:!    She conitnues to work on treatment for her depression    She is feeling less SOB and perhaps with more energy    Past Medical History  Diagnosis Date  . ABNORMAL ELECTROCARDIOGRAM   . ACID REFLUX DISEASE   . ANEMIA-NOS   . Atrial fibrillation   . BREAST CANCER   . CHF   . COPD   . DEPRESSION/ANXIETY   . DIVERTICULITIS, HX OF   . DYSGEUSIA   . Essential hypertension, benign   . FATIGUE   . GLAUCOMA   . Shortness of breath   . Pacemaker   . LYMPHEDEMA, RIGHT ARM     Past Surgical History  Procedure Date  . Mitral valve repair 2002  . Esophagogastrodenoscopy   . Partial colectomy   . Shoulder surgery   . Mastectomy     right breast   . Cardioversion 01/21/2011    Procedure: CARDIOVERSION;  Surgeon: Duke Salvia, MD;  Location: [facility] OR;  Service: Cardiovascular;  Laterality: N/A;  OUT PATIENT CARDIOVERSION    Current Outpatient Prescriptions  Medication Sig Dispense Refill  . amiodarone (PACERONE) 200 MG tablet Take 1 tablet (200 mg total) by mouth daily.  30 tablet  6  . bisacodyl (DULCOLAX) 5 MG EC tablet Take 20 mg by mouth every other day.        . brimonidine (ALPHAGAN) 0.15 % ophthalmic solution Place 1 drop into both eyes 2 (two) times daily.        . clonazePAM (KLONOPIN) 1 MG tablet Take 1 mg by mouth daily as needed. For anxiety      . escitalopram (LEXAPRO) 5 MG tablet Take 5 mg by mouth daily.      Marland Kitchen ibuprofen  (ADVIL,MOTRIN) 200 MG tablet Take 400 mg by mouth every 6 (six) hours as needed. For pain.       . mometasone (NASONEX) 50 MCG/ACT nasal spray Place 2 sprays into the nose daily.        . timolol (BETIMOL) 0.5 % ophthalmic solution Place 1 drop into both eyes daily.       Marland Kitchen tiotropium (SPIRIVA) 18 MCG inhalation capsule Place 18 mcg into inhaler and inhale as directed.        . vitamin B-12 (CYANOCOBALAMIN) 1000 MCG tablet Take 1,000 mcg by mouth daily.       Marland Kitchen warfarin (COUMADIN) 5 MG tablet Take 2.5 mg by mouth daily.        Marland Kitchen zolpidem (AMBIEN) 10 MG tablet Take 10 mg by mouth at bedtime as needed. For sleep        Allergies  Allergen Reactions  . Codeine     REACTION: Nausea and cold sweats  . Propafenone Hcl     REACTION: Reaction not known  BP 120/86  Pulse 90  Wt 124 lb 3.2 oz (56.337 kg)   Review of Systems negative except from HPI and PMH  Physical Exam Well developed and well nourished in  no acute distress HENT normal E scleral and icterus clear Neck Supple Clear to ausculation Rapid regular rhythm, no murmurs gallops or rub Soft with active bowel sounds No clubbing cyanosis none Edema Alert and oriented, grossly normal motor and sensory function Skin Warm and Dry  Device interrogation aflutter with 2:1 Assessment and  Plan

## 2011-05-16 NOTE — Anesthesia Preprocedure Evaluation (Addendum)
Anesthesia Evaluation  Patient identified by MRN, date of birth, ID band Patient awake    Reviewed: Allergy & Precautions, H&P , NPO status , Patient's Chart, lab work & pertinent test results, reviewed documented beta blocker date and time   Airway Mallampati: I      Dental  (+) Teeth Intact   Pulmonary shortness of breath and with exertion, COPD COPD inhaler,  breath sounds clear to auscultation        Cardiovascular +CHF + dysrhythmias Atrial Fibrillation + pacemaker Rhythm:Irregular     Neuro/Psych Anxiety Depression    GI/Hepatic GERD-  Controlled,  Endo/Other    Renal/GU      Musculoskeletal   Abdominal (+)  Abdomen: soft.    Peds  Hematology   Anesthesia Other Findings   Reproductive/Obstetrics                          Anesthesia Physical Anesthesia Plan  ASA: III  Anesthesia Plan: General   Post-op Pain Management:    Induction: Intravenous  Airway Management Planned: Mask  Additional Equipment:   Intra-op Plan:   Post-operative Plan:   Informed Consent: I have reviewed the patients History and Physical, chart, labs and discussed the procedure including the risks, benefits and alternatives for the proposed anesthesia with the patient or authorized representative who has indicated his/her understanding and acceptance.     Plan Discussed with: CRNA, Anesthesiologist and Surgeon  Anesthesia Plan Comments:         Anesthesia Quick Evaluation

## 2011-05-16 NOTE — Interval H&P Note (Signed)
History and Physical Interval Note:  05/16/2011 10:04 AM  [name]  has presented today for surgery, with the diagnosis of AFIB  The various methods of treatment have been discussed with the patient and family. After consideration of risks, benefits and other options for treatment, the patient has consented to  Procedure(s) (LRB): CARDIOVERSION (N/A) as a surgical intervention .  The patients' history has been reviewed, patient examined, no change in status, stable for surgery.  I have reviewed the patients' chart and labs.  Questions were answered to the patient's satisfaction.     Sherryl Manges

## 2011-05-16 NOTE — Preoperative (Signed)
Beta Blockers   Reason not to administer Beta Blockers:Not Applicable 

## 2011-05-16 NOTE — CV Procedure (Signed)
Preop Dx aflutter Post op DX  NSR  Procedure  DC Cardioversion   Pt was sedated by anesthesia receiving 40 mg Propafol  A synchronized shock 100 joules restored sinus Rhythm  Pt tolerated without difficulty

## 2011-05-17 ENCOUNTER — Encounter (HOSPITAL_COMMUNITY): Payer: Self-pay | Admitting: Internal Medicine

## 2011-05-22 ENCOUNTER — Ambulatory Visit (INDEPENDENT_AMBULATORY_CARE_PROVIDER_SITE_OTHER): Payer: Medicare Other | Admitting: *Deleted

## 2011-05-22 DIAGNOSIS — Z7901 Long term (current) use of anticoagulants: Secondary | ICD-10-CM

## 2011-05-22 DIAGNOSIS — I4891 Unspecified atrial fibrillation: Secondary | ICD-10-CM

## 2011-05-22 LAB — POCT INR
INR: 3.8
INR: 3.8

## 2011-05-23 ENCOUNTER — Encounter: Payer: Self-pay | Admitting: *Deleted

## 2011-05-29 ENCOUNTER — Ambulatory Visit (INDEPENDENT_AMBULATORY_CARE_PROVIDER_SITE_OTHER): Payer: Medicare Other | Admitting: *Deleted

## 2011-05-29 DIAGNOSIS — Z7901 Long term (current) use of anticoagulants: Secondary | ICD-10-CM

## 2011-05-29 DIAGNOSIS — I4891 Unspecified atrial fibrillation: Secondary | ICD-10-CM

## 2011-05-29 LAB — POCT INR: INR: 2.7

## 2011-06-10 ENCOUNTER — Encounter: Payer: Medicare Other | Admitting: Internal Medicine

## 2011-06-19 ENCOUNTER — Ambulatory Visit (INDEPENDENT_AMBULATORY_CARE_PROVIDER_SITE_OTHER): Payer: Medicare Other | Admitting: *Deleted

## 2011-06-19 DIAGNOSIS — I4891 Unspecified atrial fibrillation: Secondary | ICD-10-CM

## 2011-06-19 DIAGNOSIS — Z7901 Long term (current) use of anticoagulants: Secondary | ICD-10-CM

## 2011-07-02 ENCOUNTER — Telehealth: Payer: Self-pay | Admitting: Internal Medicine

## 2011-07-02 NOTE — Telephone Encounter (Signed)
Pt calling to see if she is connected to [facility]

## 2011-07-03 NOTE — Telephone Encounter (Signed)
Merlin communication established, patient aware.

## 2011-07-23 ENCOUNTER — Telehealth: Payer: Self-pay | Admitting: Internal Medicine

## 2011-07-23 MED ORDER — AMIODARONE HCL 200 MG PO TABS: 200.0000 mg | ORAL_TABLET | Freq: Every day | ORAL | Status: DC

## 2011-07-23 NOTE — Telephone Encounter (Signed)
New msg °Pt wants to talk to you about her meds. Please call °

## 2011-07-23 NOTE — Telephone Encounter (Signed)
I spoke with the patient. She states she has been out of her amiodarone for about 2 days. She needs a refill on this. I explained to her that we missed seeing her at her post DCCV visit on 4/29. She has a coumadin appointment on 6/13. We will see her the same day. She states she is seeing a neurologist for tremors. Per Dr. Graciela Husbands, she should continue amiodarone for now.

## 2011-07-23 NOTE — Telephone Encounter (Signed)
Please return call to patient at [phone] regarding previous conversation regarding Dr. Graciela Husbands.

## 2011-07-23 NOTE — Telephone Encounter (Signed)
I spoke with the patient. She will see coumadin at 3:15 pm on 6/13 and then f/u with Dr. Graciela Husbands at 4:00 pm.

## 2011-07-25 ENCOUNTER — Encounter: Payer: Self-pay | Admitting: Internal Medicine

## 2011-07-25 ENCOUNTER — Ambulatory Visit (INDEPENDENT_AMBULATORY_CARE_PROVIDER_SITE_OTHER): Payer: Medicare Other | Admitting: Internal Medicine

## 2011-07-25 ENCOUNTER — Ambulatory Visit (INDEPENDENT_AMBULATORY_CARE_PROVIDER_SITE_OTHER): Payer: Medicare Other | Admitting: *Deleted

## 2011-07-25 VITALS — BP 128/60 | HR 73 | Ht 62.0 in | Wt 122.0 lb

## 2011-07-25 DIAGNOSIS — Z7901 Long term (current) use of anticoagulants: Secondary | ICD-10-CM

## 2011-07-25 DIAGNOSIS — I4891 Unspecified atrial fibrillation: Secondary | ICD-10-CM

## 2011-07-25 DIAGNOSIS — I10 Essential (primary) hypertension: Secondary | ICD-10-CM

## 2011-07-25 LAB — PACEMAKER DEVICE OBSERVATION
AL AMPLITUDE: 4.5 mv
BAMS-0001: 150 {beats}/min
BATTERY VOLTAGE: 2.9629 V
RV LEAD AMPLITUDE: 11.3 mv
RV LEAD IMPEDENCE PM: 537.5 Ohm

## 2011-07-25 LAB — POCT INR: INR: 1.4

## 2011-07-25 NOTE — Assessment & Plan Note (Signed)
Reasonably

## 2011-07-25 NOTE — Progress Notes (Signed)
HPI  [name] is a 78 y.o. female Seen in followup for recurrence of atrial arrhythmias. 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 We recently attempted cardioversion in the context of flecainide therapy; unfortunately she has failed and has reverted to atrial arrhythmia. She is also failed sotalol. Recently initiated amiodarone therapy and electrocardioversion early December 2012 was followed by cardioversion again at the beginning of May. She had been holding sinus rhythm. However, over the last days, a tremor in her hand as well as her feet had become increasingly problematic. There've been balance issues and she decided to stop her Lexapro and to stop her amiodarone. 3 days later she feels considerably better. Her rhythm issue is primarily sinus bradycardia requiring backup bradycardia pacing. She has good intrinsic conduction.  She conitnues to work on treatment for her depression        Past Medical History  Diagnosis Date  . ABNORMAL ELECTROCARDIOGRAM   . ACID REFLUX DISEASE   . ANEMIA-NOS   . Atrial fibrillation   . BREAST CANCER   . CHF   . COPD   . DEPRESSION/ANXIETY   . DIVERTICULITIS, HX OF   . DYSGEUSIA   . Essential hypertension, benign   . FATIGUE   . GLAUCOMA   . Shortness of breath   . Pacemaker   . LYMPHEDEMA, RIGHT ARM     Past Surgical History  Procedure Date  . Mitral valve repair 2002  . Esophagogastrodenoscopy   . Partial colectomy   . Shoulder surgery   . Mastectomy     right breast   . Cardioversion 01/21/2011    Procedure: CARDIOVERSION;  Surgeon: Duke Salvia, MD;  Location: [facility] OR;  Service: Cardiovascular;  Laterality: N/A;  OUT PATIENT CARDIOVERSION  . Cardioversion 05/16/2011    Procedure: CARDIOVERSION;  Surgeon: Duke Salvia, MD;  Location: [facility] OR;  Service: Cardiovascular;  Laterality: N/A;    Current Outpatient Prescriptions  Medication Sig Dispense Refill  . amiodarone (PACERONE) 200  MG tablet Take 1 tablet (200 mg total) by mouth daily.  15 tablet  0  . bisacodyl (DULCOLAX) 5 MG EC tablet Take 20 mg by mouth every other day.        . brimonidine (ALPHAGAN) 0.15 % ophthalmic solution Place 1 drop into both eyes 2 (two) times daily.        . clonazePAM (KLONOPIN) 1 MG tablet Take 1 mg by mouth daily as needed. For anxiety      . escitalopram (LEXAPRO) 5 MG tablet Take 5 mg by mouth daily.      Marland Kitchen ibuprofen (ADVIL,MOTRIN) 200 MG tablet Take 400 mg by mouth every 6 (six) hours as needed. For pain.       . mometasone (NASONEX) 50 MCG/ACT nasal spray Place 2 sprays into the nose daily.        . timolol (BETIMOL) 0.5 % ophthalmic solution Place 1 drop into both eyes daily.       Marland Kitchen tiotropium (SPIRIVA) 18 MCG inhalation capsule Place 18 mcg into inhaler and inhale daily.       . vitamin B-12 (CYANOCOBALAMIN) 1000 MCG tablet Take 1,000 mcg by mouth daily.       Marland Kitchen warfarin (COUMADIN) 5 MG tablet Take 2.5-5 mg by mouth daily. Pt takes 1 tab alternating with a half tab      . zolpidem (AMBIEN) 10 MG tablet Take 10 mg by mouth at bedtime as needed. For sleep      .  DISCONTD: sertraline (ZOLOFT) 25 MG tablet 1/2 tablet for 7 days, then one tab daily  30 tablet  1  . DISCONTD: warfarin (COUMADIN) 5 MG tablet Take 1 tablet (5 mg total) by mouth daily.  30 tablet  11    Allergies  Allergen Reactions  . Citalopram Hydrobromide Other (See Comments)    Causes depression  . Codeine     REACTION: Nausea and cold sweats  . Cymbalta (Duloxetine Hcl) Other (See Comments)    Nausea nervousness  . Multaq (Dronedarone Hydrochloride) Other (See Comments)    unknown  . Pristiq (Desvenlafaxine Succinate Monohydrate) Other (See Comments)    palpitations  . Propafenone Hcl     REACTION: Reaction not known  . Sertraline Other (See Comments)    nervouseness  . Tramadol Other (See Comments)    unknown  BP 128/60  Pulse 73  Ht 5\' 2"  (1.575 m)  Wt 122 lb (55.339 kg)  BMI 22.31 kg/m2   Review  of Systems negative except from HPI and PMH  Physical Exam Well developed and well nourished in no acute distress HENT normal E scleral and icterus clear Neck Supple Clear to ausculation Rapid regular rhythm, no murmurs gallops or rub Soft with active bowel sounds No clubbing cyanosis none Edema Alert and oriented, grossly normal motor and sensory function Skin Warm and Dry  Device interrogation sinus rhythm with atrial pacing and intrinsic conduction Assessment and  Plan

## 2011-07-25 NOTE — Patient Instructions (Signed)

## 2011-07-25 NOTE — Assessment & Plan Note (Addendum)
The patient had been holding sinus rhythm on amiodarone. As noted above she has developed neurological symptoms which she has attributed to the amiodarone and which have apparently a large part resolved even after 2 or 3 days off amiodarone. I have told her that we can expect recurrence of atrial fibrillation/flutter. Alternative antiarrhythmic drugs would include Rythmol, dofetilide  we'll check amiodarone surveillance labs today She has not had an echo-transthoracic sotalol to measure her atrial dimension. I could consider with Dr. Johney Frame the possibility of catheter ablation of either the pulmonary veins or her AV junction in the event and when she recurs

## 2011-07-26 LAB — BASIC METABOLIC PANEL
BUN: 16 mg/dL (ref 6–23)
Chloride: 101 mEq/L (ref 96–112)
Creatinine, Ser: 0.7 mg/dL (ref 0.4–1.2)
Glucose, Bld: 88 mg/dL (ref 70–99)

## 2011-07-26 LAB — HEPATIC FUNCTION PANEL
ALT: 34 U/L (ref 0–35)
AST: 40 U/L — ABNORMAL HIGH (ref 0–37)
Albumin: 4 g/dL (ref 3.5–5.2)
Total Bilirubin: 0.7 mg/dL (ref 0.3–1.2)
Total Protein: 7.5 g/dL (ref 6.0–8.3)

## 2011-07-26 LAB — TSH: TSH: 1.24 u[IU]/mL (ref 0.35–5.50)

## 2011-08-01 IMAGING — CR DG CHEST 2V
2 series · 2 of 2 positions shown · non-contrast
Comparison: None.

CLINICAL DATA: Pre cardioversion.  Shortness of breath with history
of COPD.

CHEST - 2 VIEW

[view not recorded (1 of 2)]
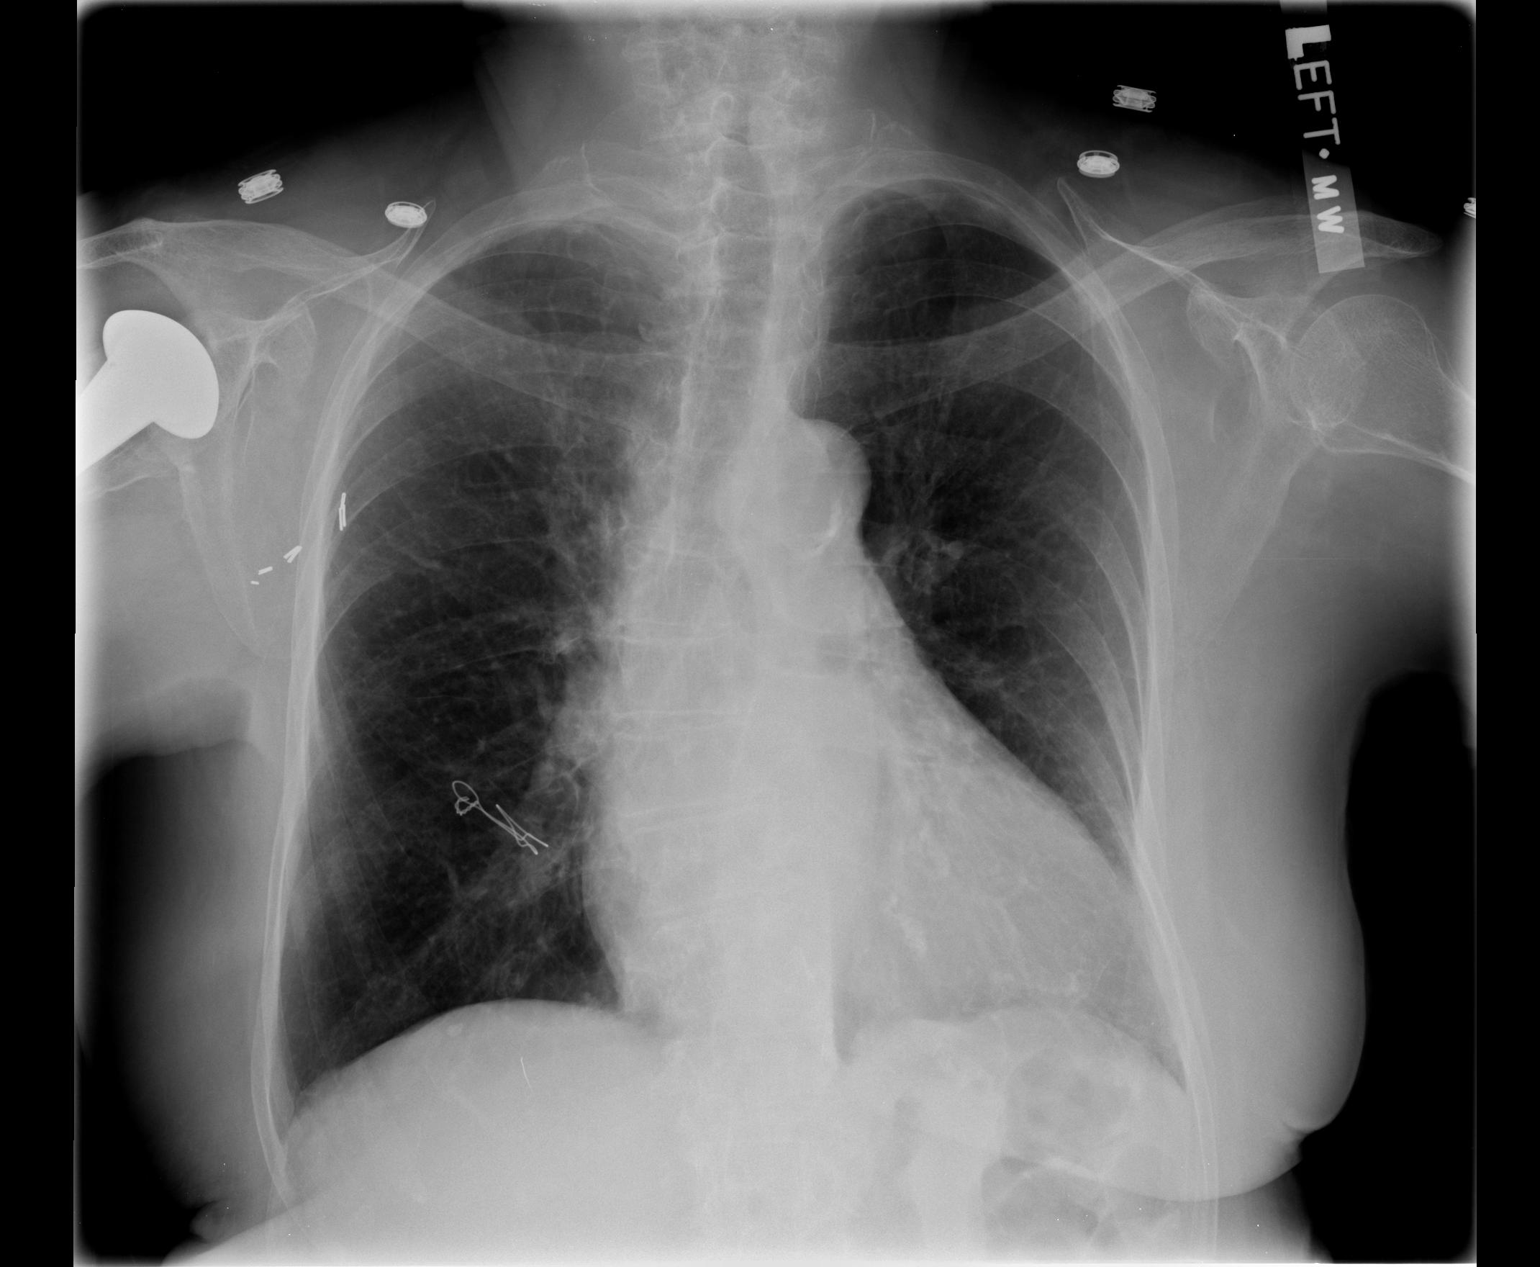

[view not recorded (2 of 2)]
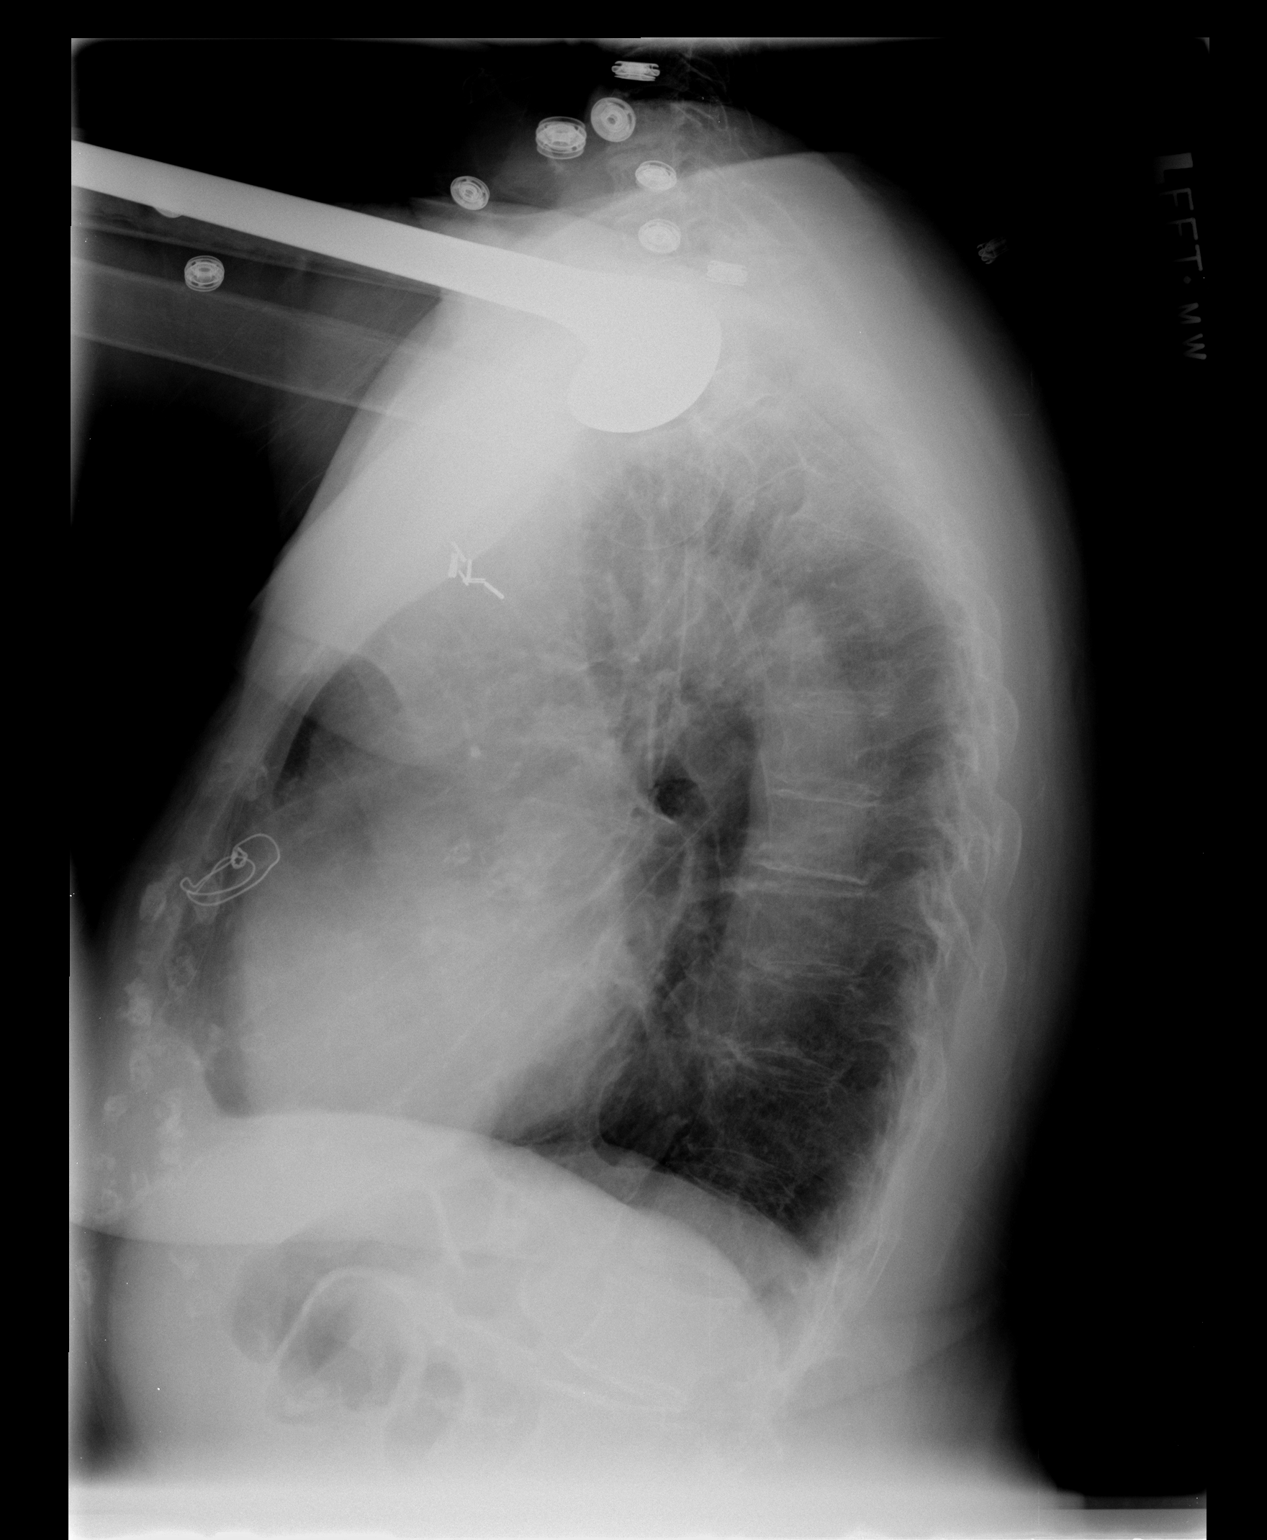

[2 of 2 positions shown; findings below may reference images not displayed]

FINDINGS: The heart is enlarged.  There is aortic atherosclerosis.
A suture is noted within the right anterior chest wall along the
anterior costal margin.  There are surgical clips in the right
axilla.  The lungs appear clear.  There is no pleural effusion.
The patient is status post right shoulder arthroplasty.
IMPRESSION: Cardiomegaly without demonstrated acute cardiopulmonary process.

## 2011-08-03 IMAGING — CR DG CHEST 2V
1 series · 1 of 1 positions shown · non-contrast
Comparison: 03/22/2010.

CLINICAL DATA: 86-year-old female status post pacemaker placement.

CHEST - 2 VIEW

[view not recorded]
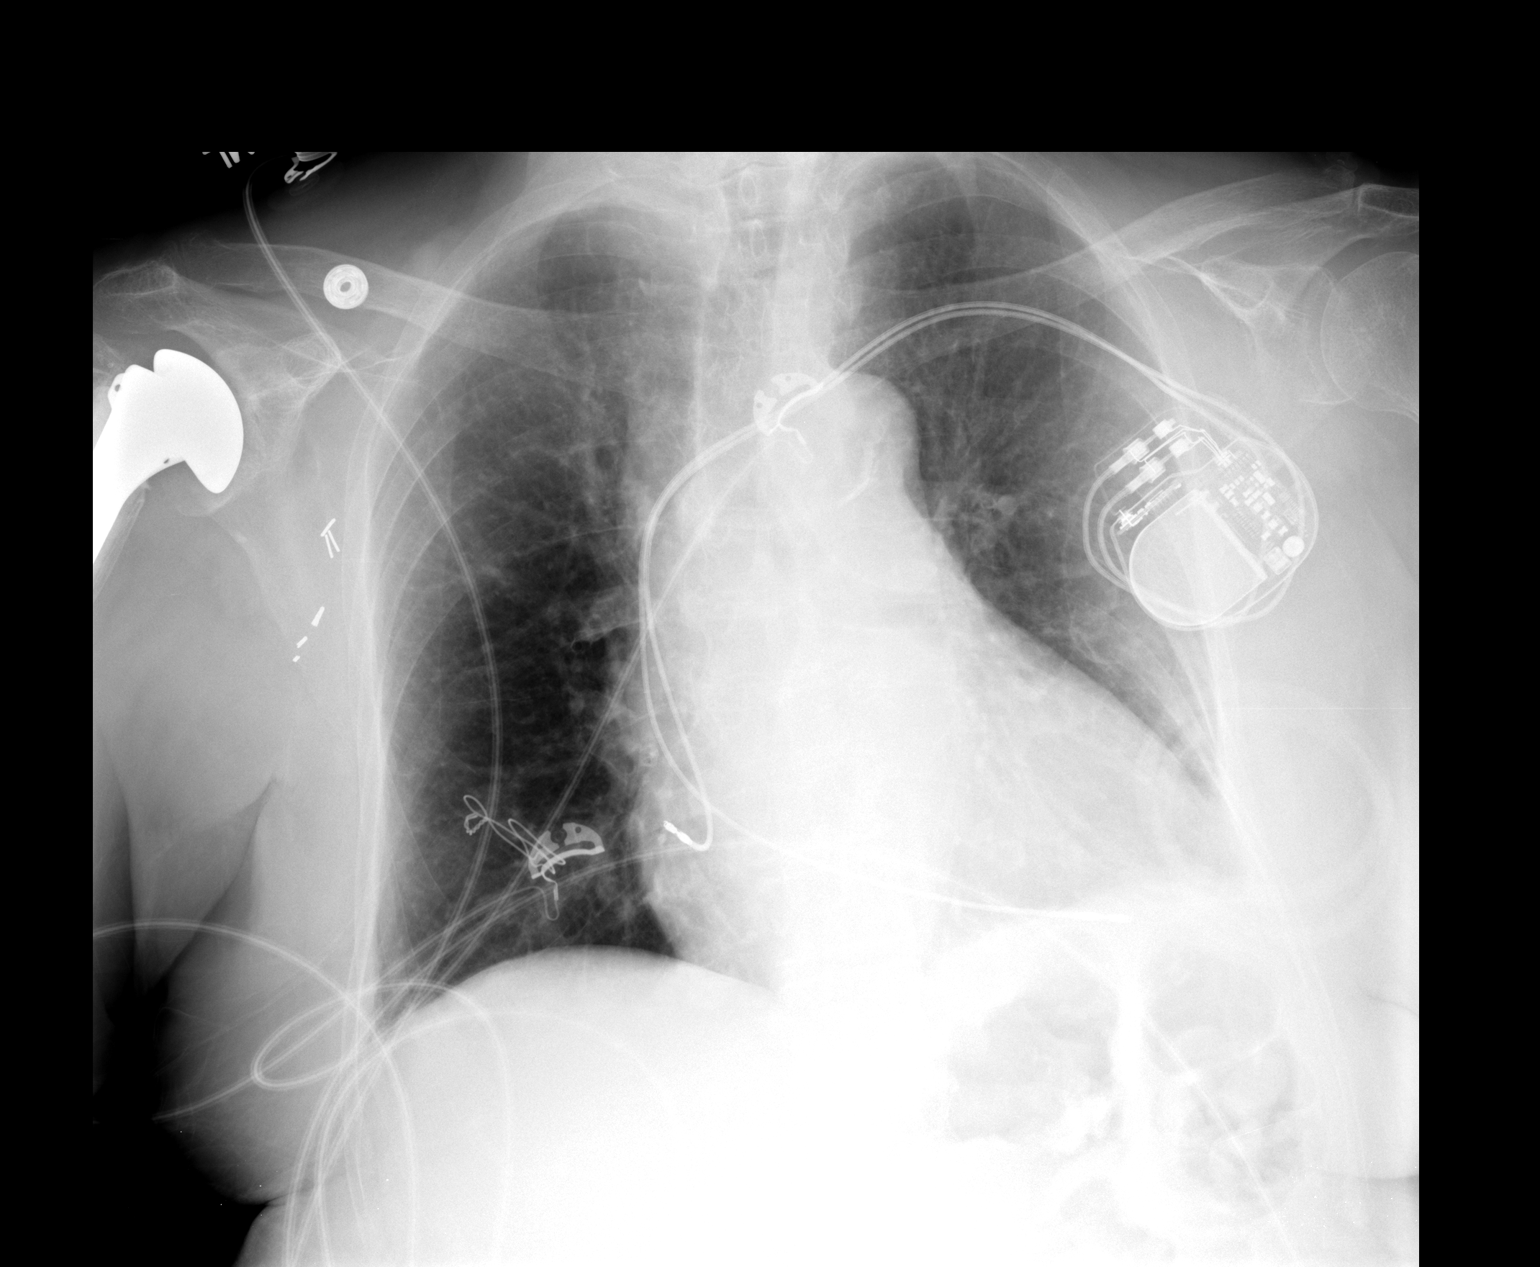

[1 of 1 positions shown; findings below may reference images not displayed]

FINDINGS: New left side dual lead transvenous cardiac pacemaker.
Lead terminates in the right atrium and RV apex regions.  The no
pneumothorax.  Lower lung volumes with left basilar atelectasis.
Stable cardiac size and mediastinal contours.  Stable postoperative
changes to the right shoulder and axilla.
IMPRESSION: Left chest dual lead cardiac pacemaker with no adverse features.
Left lung atelectasis.

## 2011-08-16 ENCOUNTER — Telehealth: Payer: Self-pay | Admitting: Pharmacist

## 2011-08-16 NOTE — Telephone Encounter (Signed)
Called pt.  She was given a prescription to have her INR checked in [place] last wee on 6/27.  We have not received a result yet.  Spoke with pt.  She has not gone to have the lab done yet.  I have asked her to do it either today or tomorrow.  She states she will call us on Monday.  She will not be back in [place] until 7/17.  She is aware this is longer than we want to wait to check her INR.

## 2011-08-19 ENCOUNTER — Encounter: Payer: Self-pay | Admitting: *Deleted

## 2011-08-19 LAB — PROTIME-INR: INR: 1.9 — AB (ref 0.9–1.1)

## 2011-08-21 ENCOUNTER — Ambulatory Visit: Payer: Self-pay | Admitting: Internal Medicine

## 2011-08-21 DIAGNOSIS — I4891 Unspecified atrial fibrillation: Secondary | ICD-10-CM

## 2011-09-10 ENCOUNTER — Telehealth: Payer: Self-pay | Admitting: Internal Medicine

## 2011-09-10 NOTE — Telephone Encounter (Signed)
error 

## 2011-09-11 ENCOUNTER — Ambulatory Visit (INDEPENDENT_AMBULATORY_CARE_PROVIDER_SITE_OTHER): Payer: Medicare Other | Admitting: *Deleted

## 2011-09-11 DIAGNOSIS — I4891 Unspecified atrial fibrillation: Secondary | ICD-10-CM

## 2011-09-11 DIAGNOSIS — Z7901 Long term (current) use of anticoagulants: Secondary | ICD-10-CM

## 2011-09-12 ENCOUNTER — Telehealth: Payer: Self-pay | Admitting: Internal Medicine

## 2011-09-12 ENCOUNTER — Other Ambulatory Visit: Payer: Self-pay | Admitting: *Deleted

## 2011-09-12 DIAGNOSIS — I4891 Unspecified atrial fibrillation: Secondary | ICD-10-CM

## 2011-09-12 DIAGNOSIS — Z79899 Other long term (current) drug therapy: Secondary | ICD-10-CM

## 2011-09-12 NOTE — Telephone Encounter (Signed)
I spoke with the patient and discussed repeating her liver function test per Dr. Graciela Husbands. She will have this done with her next coumadin check on 09/25/11.

## 2011-09-12 NOTE — Telephone Encounter (Signed)
New problem:  Patient calling to discuss lab results.

## 2011-09-25 ENCOUNTER — Ambulatory Visit (INDEPENDENT_AMBULATORY_CARE_PROVIDER_SITE_OTHER): Payer: Medicare Other | Admitting: *Deleted

## 2011-09-25 ENCOUNTER — Other Ambulatory Visit: Payer: Medicare Other

## 2011-09-25 ENCOUNTER — Other Ambulatory Visit (INDEPENDENT_AMBULATORY_CARE_PROVIDER_SITE_OTHER): Payer: Medicare Other

## 2011-09-25 DIAGNOSIS — Z7901 Long term (current) use of anticoagulants: Secondary | ICD-10-CM

## 2011-09-25 DIAGNOSIS — I4891 Unspecified atrial fibrillation: Secondary | ICD-10-CM

## 2011-09-25 DIAGNOSIS — Z79899 Other long term (current) drug therapy: Secondary | ICD-10-CM

## 2011-09-25 LAB — HEPATIC FUNCTION PANEL
ALT: 18 U/L (ref 0–35)
AST: 26 U/L (ref 0–37)
Albumin: 3.9 g/dL (ref 3.5–5.2)
Total Bilirubin: 0.4 mg/dL (ref 0.3–1.2)
Total Protein: 7.3 g/dL (ref 6.0–8.3)

## 2011-10-09 ENCOUNTER — Ambulatory Visit (INDEPENDENT_AMBULATORY_CARE_PROVIDER_SITE_OTHER): Payer: Medicare Other | Admitting: Pharmacist

## 2011-10-09 DIAGNOSIS — I4891 Unspecified atrial fibrillation: Secondary | ICD-10-CM

## 2011-10-09 DIAGNOSIS — Z7901 Long term (current) use of anticoagulants: Secondary | ICD-10-CM

## 2011-10-09 LAB — POCT INR: INR: 1.9

## 2011-10-23 ENCOUNTER — Ambulatory Visit (INDEPENDENT_AMBULATORY_CARE_PROVIDER_SITE_OTHER): Payer: Medicare Other | Admitting: *Deleted

## 2011-10-23 DIAGNOSIS — Z7901 Long term (current) use of anticoagulants: Secondary | ICD-10-CM

## 2011-10-23 DIAGNOSIS — I4891 Unspecified atrial fibrillation: Secondary | ICD-10-CM

## 2011-10-23 LAB — POCT INR: INR: 1.7

## 2011-10-28 ENCOUNTER — Encounter: Payer: Medicare Other | Admitting: *Deleted

## 2011-10-29 ENCOUNTER — Encounter: Payer: Self-pay | Admitting: *Deleted

## 2011-11-06 ENCOUNTER — Ambulatory Visit (INDEPENDENT_AMBULATORY_CARE_PROVIDER_SITE_OTHER): Payer: Medicare Other | Admitting: *Deleted

## 2011-11-06 DIAGNOSIS — Z7901 Long term (current) use of anticoagulants: Secondary | ICD-10-CM

## 2011-11-06 DIAGNOSIS — I4891 Unspecified atrial fibrillation: Secondary | ICD-10-CM

## 2011-11-29 ENCOUNTER — Other Ambulatory Visit: Payer: Self-pay | Admitting: Cardiovascular Disease

## 2011-12-04 ENCOUNTER — Ambulatory Visit (INDEPENDENT_AMBULATORY_CARE_PROVIDER_SITE_OTHER): Payer: Medicare Other | Admitting: *Deleted

## 2011-12-04 DIAGNOSIS — I4891 Unspecified atrial fibrillation: Secondary | ICD-10-CM

## 2011-12-04 DIAGNOSIS — Z7901 Long term (current) use of anticoagulants: Secondary | ICD-10-CM

## 2011-12-05 ENCOUNTER — Encounter: Payer: Self-pay | Admitting: Internal Medicine

## 2011-12-05 ENCOUNTER — Encounter: Payer: Self-pay | Admitting: *Deleted

## 2011-12-05 ENCOUNTER — Ambulatory Visit (INDEPENDENT_AMBULATORY_CARE_PROVIDER_SITE_OTHER): Payer: Medicare Other | Admitting: *Deleted

## 2011-12-05 DIAGNOSIS — I4891 Unspecified atrial fibrillation: Secondary | ICD-10-CM

## 2011-12-05 DIAGNOSIS — Z950 Presence of cardiac pacemaker: Secondary | ICD-10-CM

## 2011-12-05 LAB — REMOTE PACEMAKER DEVICE
AL IMPEDENCE PM: 450 Ohm
ATRIAL PACING PM: 91
BATTERY VOLTAGE: 2.96 V
RV LEAD AMPLITUDE: 12 mv

## 2011-12-11 ENCOUNTER — Telehealth: Payer: Self-pay | Admitting: Internal Medicine

## 2011-12-11 NOTE — Telephone Encounter (Signed)
Pt calling re heart rate 115-118, should she be concerned

## 2011-12-12 ENCOUNTER — Telehealth: Payer: Self-pay | Admitting: Internal Medicine

## 2011-12-12 NOTE — Telephone Encounter (Signed)
Pt decided she will call back tomorrow to talk with cory

## 2011-12-12 NOTE — Telephone Encounter (Signed)
Routed to [facility] per note request.

## 2011-12-12 NOTE — Telephone Encounter (Signed)
Pt was called, she states her pulse is normal today and will call back if needs, c/o non healing scab on shin, advised PCP or walk in clinic, pt accepting of plan.

## 2011-12-12 NOTE — Telephone Encounter (Signed)
error 

## 2011-12-12 NOTE — Telephone Encounter (Signed)
Pt concerned she didn't get a call back yesterday, would like to know if her heart rate is an issue? pls call

## 2011-12-18 ENCOUNTER — Ambulatory Visit (INDEPENDENT_AMBULATORY_CARE_PROVIDER_SITE_OTHER): Payer: Medicare Other | Admitting: *Deleted

## 2011-12-18 DIAGNOSIS — I4891 Unspecified atrial fibrillation: Secondary | ICD-10-CM

## 2011-12-18 DIAGNOSIS — Z7901 Long term (current) use of anticoagulants: Secondary | ICD-10-CM

## 2011-12-18 LAB — POCT INR: INR: 2.9

## 2011-12-24 ENCOUNTER — Other Ambulatory Visit: Payer: Self-pay | Admitting: *Deleted

## 2011-12-24 ENCOUNTER — Ambulatory Visit (INDEPENDENT_AMBULATORY_CARE_PROVIDER_SITE_OTHER): Payer: Medicare Other | Admitting: Nurse Practitioner

## 2011-12-24 ENCOUNTER — Encounter: Payer: Self-pay | Admitting: Nurse Practitioner

## 2011-12-24 ENCOUNTER — Telehealth: Payer: Self-pay | Admitting: Internal Medicine

## 2011-12-24 VITALS — BP 110/80 | HR 116 | Ht 62.0 in | Wt 127.0 lb

## 2011-12-24 DIAGNOSIS — I4892 Unspecified atrial flutter: Secondary | ICD-10-CM

## 2011-12-24 MED ORDER — DILTIAZEM HCL ER COATED BEADS 240 MG PO CP24: 240.0000 mg | ORAL_CAPSULE | Freq: Every day | ORAL | Status: DC

## 2011-12-24 NOTE — Telephone Encounter (Signed)
Pt states she's rtn call to cory, I do not see that she was called, will leave message for cory to call pt @ [phone]

## 2011-12-24 NOTE — Patient Instructions (Addendum)
We are adding Diltiazem 240 mg each day  Stay on your coumadin  We will check labs today and arrange for an echo  See Dr. Graciela Husbands next week to discuss further options  We will refer you to primary care  Call the [facility] office at [phone] if you have any questions, problems or concerns.

## 2011-12-24 NOTE — Addendum Note (Signed)
Addended by: Brien Mates R on: 12/24/2011 12:41 PM   Modules accepted: Orders

## 2011-12-24 NOTE — Progress Notes (Signed)
[name] Date of Birth: Jul 02, 1923 Medical Record #[id number]  History of Present Illness: [name] is seen back today for a work in visit. She is seen for Dr. Graciela Husbands. She has a history of atrial arrhythmias. Has multiple drug intolerances. Remains on chronic coumadin. Has had prior ablations. She has no known CAD per remote cath in 2002 prior to her MV repair at [facility]. No recent echo. Other issues include depression and COPD.  She comes in today. She is here alone. She has noted for 2 weeks that her heart has been out of rhythm. She feels more nervous. Feels more palpitations. She is more fatigued and weak. She can feel it beating fast. She has apparently not tolerated Flecainide, Sotalol, Multaq, Amiodarone and Propafenone. Does not look like she has tried Therapist, occupational. She is not interested in a repeat ablation or going to the hospital at this time. Her coumadin has been going ok with no problems noted. She has had some fleeting chest pain. She admits she is quite anxious.   Current Outpatient Prescriptions on File Prior to Visit  Medication Sig Dispense Refill  . bisacodyl (DULCOLAX) 5 MG EC tablet Take 20 mg by mouth every other day.        . brimonidine (ALPHAGAN) 0.15 % ophthalmic solution Place 1 drop into both eyes 2 (two) times daily.        . clonazePAM (KLONOPIN) 1 MG tablet Take 1 mg by mouth daily as needed. For anxiety      . ibuprofen (ADVIL,MOTRIN) 200 MG tablet Take 400 mg by mouth every 6 (six) hours as needed. For pain.       Marland Kitchen timolol (BETIMOL) 0.5 % ophthalmic solution Place 1 drop into both eyes daily.       Marland Kitchen tiotropium (SPIRIVA) 18 MCG inhalation capsule Place 18 mcg into inhaler and inhale daily.       . vitamin B-12 (CYANOCOBALAMIN) 1000 MCG tablet Take 1,000 mcg by mouth daily.       Marland Kitchen warfarin (COUMADIN) 5 MG tablet take 1 tablet by mouth once daily or as directed BY COUMADIN CLINIC  30 tablet  3  . zolpidem (AMBIEN) 10 MG tablet Take 10 mg by mouth at bedtime as  needed. For sleep      . diltiazem (CARDIZEM CD) 240 MG 24 hr capsule Take 1 capsule (240 mg total) by mouth daily.  30 capsule  3  . [DISCONTINUED] sertraline (ZOLOFT) 25 MG tablet 1/2 tablet for 7 days, then one tab daily  30 tablet  1    Allergies  Allergen Reactions  . Citalopram Hydrobromide Other (See Comments)    Causes depression  . Codeine     REACTION: Nausea and cold sweats  . Cymbalta (Duloxetine Hcl) Other (See Comments)    Nausea nervousness  . Multaq (Dronedarone Hydrochloride) Other (See Comments)    unknown  . Pristiq (Desvenlafaxine Succinate Monohydrate) Other (See Comments)    palpitations  . Propafenone Hcl     REACTION: Reaction not known  . Sertraline Other (See Comments)    nervouseness  . Tramadol Other (See Comments)    unknown    Past Medical History  Diagnosis Date  . ABNORMAL ELECTROCARDIOGRAM   . ACID REFLUX DISEASE   . ANEMIA-NOS   . Atrial fibrillation   . BREAST CANCER   . CHF   . COPD   . DEPRESSION/ANXIETY   . DIVERTICULITIS, HX OF   . DYSGEUSIA   . Essential hypertension, benign   .  FATIGUE   . GLAUCOMA   . Shortness of breath   . Pacemaker   . LYMPHEDEMA, RIGHT ARM     Past Surgical History  Procedure Date  . Mitral valve repair 2002  . Esophagogastrodenoscopy   . Partial colectomy   . Shoulder surgery   . Mastectomy     right breast   . Cardioversion 01/21/2011    Procedure: CARDIOVERSION;  Surgeon: Duke Salvia, MD;  Location: [facility] OR;  Service: Cardiovascular;  Laterality: N/A;  OUT PATIENT CARDIOVERSION  . Cardioversion 05/16/2011    Procedure: CARDIOVERSION;  Surgeon: Duke Salvia, MD;  Location: [facility] OR;  Service: Cardiovascular;  Laterality: N/A;    History  Smoking status  . Never Smoker   Smokeless tobacco  . Never Used    History  Alcohol Use  . 2.0 oz/week  . 4 drink(s) per week    Comment: glass of wine every other day    Family History  Problem Relation Age of Onset  . Other Mother 47    Died  from old age  . Other Father 47    car accident  . Uterine cancer Sister 25    Review of Systems: The review of systems is per the HPI.  All other systems were reviewed and are negative.  Physical Exam: BP 110/80  Pulse 116  Ht 5\' 2"  (1.575 m)  Wt 127 lb (57.607 kg)  BMI 23.23 kg/m2 Patient is very pleasant and in no acute distress. Very well dressed. Skin is warm and dry. Color is normal.  HEENT is unremarkable. Normocephalic/atraumatic. PERRL. Sclera are nonicteric. Neck is supple. No masses. No JVD. Lungs are clear. Cardiac exam shows an irregular rhythm. Rate is increased. Abdomen is soft. Extremities are without edema. Gait and ROM are intact. No gross neurologic deficits noted.  LABORATORY DATA: EKG today shows atrial flutter with increased VR. 2:1 conduction. Anterior Q's which are unchanged.   Assessment / Plan: Recurrent atrial flutter - rate is up. She is on coumadin. I have talked with Dr. Excell Seltzer. She does not wish to go to the hospital. She is not interested in repeat ablation. Will update her echo and check labs today. We will try to rate control with Diltiazem. This will probably be difficult. I have follow up planned with Dr. Graciela Husbands for next week. Patient is agreeable to this plan and will call if any problems develop in the interim.

## 2011-12-24 NOTE — Telephone Encounter (Signed)
New problem:    C/O rapid heart beat . Pain in chest.

## 2011-12-24 NOTE — Telephone Encounter (Signed)
Pt c/o of HR of 118 which is high for her.  She is feeling very fatigued and having intermittent episodes of chest pain.  Offered for pt to see Norma Fredrickson today at 11:30.  Pt will call back and schedule if ride available.

## 2011-12-30 ENCOUNTER — Ambulatory Visit (HOSPITAL_COMMUNITY): Payer: Medicare Other | Attending: Cardiology | Admitting: Radiology

## 2011-12-30 DIAGNOSIS — I4892 Unspecified atrial flutter: Secondary | ICD-10-CM

## 2011-12-30 DIAGNOSIS — R0989 Other specified symptoms and signs involving the circulatory and respiratory systems: Secondary | ICD-10-CM

## 2011-12-30 NOTE — Progress Notes (Addendum)
Echocardiogram not performed per Debby Freiberg PA secondary heart rate. Patient will see Dr. Graciela Husbands tomorrow.  Okay for patient to go home and take klonopin.

## 2011-12-31 ENCOUNTER — Ambulatory Visit (INDEPENDENT_AMBULATORY_CARE_PROVIDER_SITE_OTHER): Payer: Medicare Other | Admitting: Internal Medicine

## 2011-12-31 ENCOUNTER — Other Ambulatory Visit: Payer: Self-pay | Admitting: *Deleted

## 2011-12-31 ENCOUNTER — Ambulatory Visit (INDEPENDENT_AMBULATORY_CARE_PROVIDER_SITE_OTHER): Payer: Medicare Other | Admitting: Pharmacist

## 2011-12-31 ENCOUNTER — Encounter: Payer: Self-pay | Admitting: Internal Medicine

## 2011-12-31 ENCOUNTER — Other Ambulatory Visit (INDEPENDENT_AMBULATORY_CARE_PROVIDER_SITE_OTHER): Payer: Medicare Other

## 2011-12-31 ENCOUNTER — Encounter: Payer: Self-pay | Admitting: *Deleted

## 2011-12-31 VITALS — BP 133/88 | HR 138 | Resp 16 | Ht 62.0 in | Wt 129.2 lb

## 2011-12-31 DIAGNOSIS — I4892 Unspecified atrial flutter: Secondary | ICD-10-CM

## 2011-12-31 DIAGNOSIS — Z79899 Other long term (current) drug therapy: Secondary | ICD-10-CM

## 2011-12-31 DIAGNOSIS — I4891 Unspecified atrial fibrillation: Secondary | ICD-10-CM

## 2011-12-31 DIAGNOSIS — Z7901 Long term (current) use of anticoagulants: Secondary | ICD-10-CM

## 2011-12-31 DIAGNOSIS — Z950 Presence of cardiac pacemaker: Secondary | ICD-10-CM

## 2011-12-31 DIAGNOSIS — I509 Heart failure, unspecified: Secondary | ICD-10-CM

## 2011-12-31 LAB — POCT INR: INR: 2.7

## 2011-12-31 MED ORDER — FUROSEMIDE 20 MG PO TABS: 20.0000 mg | ORAL_TABLET | Freq: Every day | ORAL | Status: DC

## 2011-12-31 NOTE — Assessment & Plan Note (Signed)
Heart failure is related currently to rapid rates. We'll give her a low dose diuretic, Lasix 20 mg to take it daily for the next few days.

## 2011-12-31 NOTE — Assessment & Plan Note (Signed)
Atrial flutter was identified. Attempt to pace terminate were unsuccessful but we did create a different flutter. We'll undertake cardioversion

## 2011-12-31 NOTE — Progress Notes (Signed)
Patient Care Team: Loyal Jacobson as PCP - General (Family Medicine)   HPI  [name] is a 78 y.o. female Seen in followup for recurrence of atrial arrhythmias. 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 We recently attempted cardioversion in the context of flecainide therapy; unfortunately she has failed and has reverted to atrial arrhythmia. She is also failed sotalol and amiodarone also was intolerable secondary to tremor  Over recent days she has been having more problems with dyspnea edema and was found to be in atrial flutter with a rapid rate. Augmented rate control was attempted but without success. Echocardiogram 8/11 demonstrated normal left ventricular function.   Past Medical History  Diagnosis Date  . ABNORMAL ELECTROCARDIOGRAM   . ACID REFLUX DISEASE   . ANEMIA-NOS   . Atrial fibrillation   . BREAST CANCER   . CHF   . COPD   . DEPRESSION/ANXIETY   . DIVERTICULITIS, HX OF   . DYSGEUSIA   . Essential hypertension, benign   . FATIGUE   . GLAUCOMA   . Shortness of breath   . Pacemaker   . LYMPHEDEMA, RIGHT ARM     Past Surgical History  Procedure Date  . Mitral valve repair 2002  . Esophagogastrodenoscopy   . Partial colectomy   . Shoulder surgery   . Mastectomy     right breast   . Cardioversion 01/21/2011    Procedure: CARDIOVERSION;  Surgeon: Duke Salvia, MD;  Location: [facility] OR;  Service: Cardiovascular;  Laterality: N/A;  OUT PATIENT CARDIOVERSION  . Cardioversion 05/16/2011    Procedure: CARDIOVERSION;  Surgeon: Duke Salvia, MD;  Location: [facility] OR;  Service: Cardiovascular;  Laterality: N/A;    Current Outpatient Prescriptions  Medication Sig Dispense Refill  . brimonidine (ALPHAGAN) 0.15 % ophthalmic solution Place 1 drop into both eyes 2 (two) times daily.        . clonazePAM (KLONOPIN) 1 MG tablet Take 1 mg by mouth daily as needed. For anxiety      . Docusate Sodium (DULCOLAX STOOL SOFTENER PO) Take by  mouth.      Marland Kitchen ibuprofen (ADVIL,MOTRIN) 200 MG tablet Take 400 mg by mouth every 6 (six) hours as needed. For pain.       Marland Kitchen timolol (BETIMOL) 0.5 % ophthalmic solution Place 1 drop into both eyes daily.       Marland Kitchen tiotropium (SPIRIVA) 18 MCG inhalation capsule Place 18 mcg into inhaler and inhale daily.       . vitamin B-12 (CYANOCOBALAMIN) 1000 MCG tablet Take 1,000 mcg by mouth daily.       Marland Kitchen warfarin (COUMADIN) 5 MG tablet take 1 tablet by mouth once daily or as directed BY COUMADIN CLINIC  30 tablet  3  . zolpidem (AMBIEN) 10 MG tablet Take 10 mg by mouth at bedtime as needed. For sleep      . [DISCONTINUED] sertraline (ZOLOFT) 25 MG tablet 1/2 tablet for 7 days, then one tab daily  30 tablet  1    Allergies  Allergen Reactions  . Citalopram Hydrobromide Other (See Comments)    Causes depression  . Codeine     REACTION: Nausea and cold sweats  . Cymbalta (Duloxetine Hcl) Other (See Comments)    Nausea nervousness  . Multaq (Dronedarone Hydrochloride) Other (See Comments)    unknown  . Pristiq (Desvenlafaxine Succinate Monohydrate) Other (See Comments)    palpitations  . Propafenone Hcl     REACTION: Reaction  not known  . Sertraline Other (See Comments)    nervouseness  . Tramadol Other (See Comments)    unknown    Review of Systems negative except from HPI and PMH  Physical Exam BP 133/88  Pulse 138  Resp 16  Ht 5\' 2"  (1.575 m)  Wt 129 lb 3.2 oz (58.605 kg)  BMI 23.63 kg/m2 Well developed and well nourished in no acute distress HENT normal E scleral and icterus clear Neck Supple JVP flat; carotids brisk and full Clear to ausculation  Rapid but regular  rate and rhythm, no murmurs gallops or rub Soft with active bowel sounds No clubbing cyanosis 1+ Edema Alert and oriented, grossly normal motor and sensory function Skin Warm and Dry  Electrocardiogram demonstrates atrial flutter with a typical pattern inferiorly and upright P waves in lead V1. Following efforts to  pace terminate, with a history of atrial flutter morphology was identified  Assessment and  Plan

## 2011-12-31 NOTE — Assessment & Plan Note (Signed)
Patient has reverted to rapid atrial flutter. This is quite symptomatic. I'm not sure the drugs are going to be reasonable long-term option but she felt sinus rhythm for a number of months now. In the immediate face, as she is going on vacation, we will undertake cardioversion.

## 2011-12-31 NOTE — Patient Instructions (Addendum)
Cardioversion scheduled for Thursday, November 21st at [facility] with Dr Graciela Husbands.   -Be at the short stay center at [facility] at [facility].  -Nothing to eat or drink after midnight Wednesday night.  -You will need someone to drive you home.   Take Lasix 20mg  1 tablet daily for 1 week.

## 2012-01-02 ENCOUNTER — Encounter (HOSPITAL_COMMUNITY)
Admission: RE | Disposition: A | Discharge status: Home / Self Care | Payer: Self-pay | Source: Ambulatory Visit | Attending: Internal Medicine

## 2012-01-02 ENCOUNTER — Encounter (HOSPITAL_COMMUNITY): Payer: Self-pay

## 2012-01-02 ENCOUNTER — Ambulatory Visit (HOSPITAL_COMMUNITY)
Admission: RE | Admit: 2012-01-02 | Discharge: 2012-01-02 | Disposition: A | Discharge status: Home / Self Care | Payer: Medicare Other | Source: Ambulatory Visit | Attending: Internal Medicine | Admitting: Internal Medicine

## 2012-01-02 ENCOUNTER — Encounter (HOSPITAL_COMMUNITY): Payer: Self-pay | Admitting: Anesthesiology

## 2012-01-02 ENCOUNTER — Ambulatory Visit (HOSPITAL_COMMUNITY): Payer: Medicare Other | Admitting: Anesthesiology

## 2012-01-02 DIAGNOSIS — I10 Essential (primary) hypertension: Secondary | ICD-10-CM | POA: Insufficient documentation

## 2012-01-02 DIAGNOSIS — I4892 Unspecified atrial flutter: Secondary | ICD-10-CM

## 2012-01-02 DIAGNOSIS — K219 Gastro-esophageal reflux disease without esophagitis: Secondary | ICD-10-CM | POA: Insufficient documentation

## 2012-01-02 DIAGNOSIS — J449 Chronic obstructive pulmonary disease, unspecified: Secondary | ICD-10-CM | POA: Insufficient documentation

## 2012-01-02 DIAGNOSIS — J4489 Other specified chronic obstructive pulmonary disease: Secondary | ICD-10-CM | POA: Insufficient documentation

## 2012-01-02 DIAGNOSIS — Z950 Presence of cardiac pacemaker: Secondary | ICD-10-CM | POA: Insufficient documentation

## 2012-01-02 DIAGNOSIS — I509 Heart failure, unspecified: Secondary | ICD-10-CM | POA: Insufficient documentation

## 2012-01-02 DIAGNOSIS — I4891 Unspecified atrial fibrillation: Secondary | ICD-10-CM | POA: Insufficient documentation

## 2012-01-02 DIAGNOSIS — F411 Generalized anxiety disorder: Secondary | ICD-10-CM | POA: Insufficient documentation

## 2012-01-02 HISTORY — PX: CARDIOVERSION: SHX1299

## 2012-01-02 LAB — CBC WITH DIFFERENTIAL/PLATELET
Basophils Absolute: 0 10*3/uL (ref 0.0–0.1)
Basophils Relative: 0.4 % (ref 0.0–3.0)
Eosinophils Absolute: 0.1 10*3/uL (ref 0.0–0.7)
Eosinophils Relative: 1.4 % (ref 0.0–5.0)
HCT: 45.2 % (ref 36.0–46.0)
Hemoglobin: 15.1 g/dL — ABNORMAL HIGH (ref 12.0–15.0)
Lymphocytes Relative: 27.7 % (ref 12.0–46.0)
Lymphs Abs: 1.1 10*3/uL (ref 0.7–4.0)
MCHC: 33.5 g/dL (ref 30.0–36.0)
MCV: 99.8 fl (ref 78.0–100.0)
Monocytes Absolute: 0.4 10*3/uL (ref 0.1–1.0)
Monocytes Relative: 10.6 % (ref 3.0–12.0)
Neutro Abs: 2.5 10*3/uL (ref 1.4–7.7)
Neutrophils Relative %: 59.9 % (ref 43.0–77.0)
Platelets: 140 10*3/uL — ABNORMAL LOW (ref 150.0–400.0)
RBC: 4.53 Mil/uL (ref 3.87–5.11)
RDW: 13.9 % (ref 11.5–14.6)
WBC: 4.1 10*3/uL — ABNORMAL LOW (ref 4.5–10.5)

## 2012-01-02 LAB — BASIC METABOLIC PANEL
BUN: 16 mg/dL (ref 6–23)
CO2: 22 mEq/L (ref 19–32)
Calcium: 9.3 mg/dL (ref 8.4–10.5)
Chloride: 104 mEq/L (ref 96–112)
Creatinine, Ser: 0.7 mg/dL (ref 0.4–1.2)
GFR: 85.7 mL/min (ref >60.00)
Glucose, Bld: 94 mg/dL (ref 70–99)
Potassium: 4 mEq/L (ref 3.5–5.1)
Sodium: 136 mEq/L (ref 135–145)

## 2012-01-02 LAB — TSH: TSH: 1.11 u[IU]/mL (ref 0.35–5.50)

## 2012-01-02 SURGERY — CARDIOVERSION
Anesthesia: Monitor Anesthesia Care

## 2012-01-02 MED ORDER — PROPOFOL 10 MG/ML IV BOLUS
INTRAVENOUS | Status: DC | PRN
  Administered 2012-01-02: 100 mg via INTRAVENOUS

## 2012-01-02 MED ORDER — SODIUM CHLORIDE 0.9 % IV SOLN
INTRAVENOUS | Status: DC
  Administered 2012-01-02: 09:00:00 via INTRAVENOUS

## 2012-01-02 MED ORDER — SODIUM CHLORIDE 0.9 % IV SOLN
INTRAVENOUS | Status: DC | PRN
  Administered 2012-01-02: 09:00:00 via INTRAVENOUS

## 2012-01-02 NOTE — Transfer of Care (Signed)
Immediate Anesthesia Transfer of Care Note  Patient: [name]  Procedure(s) Performed: Procedure(s) (LRB) with comments: CARDIOVERSION (N/A) - Dr. Graciela Husbands will be coming over to do Cardioversion  Patient Location: PACU and Endoscopy Unit  Anesthesia Type:MAC  Level of Consciousness: awake, alert , sedated and patient cooperative  Airway & Oxygen Therapy: Patient Spontanous Breathing and Patient connected to nasal cannula oxygen  Post-op Assessment: Report given to PACU RN, Post -op Vital signs reviewed and stable and Patient moving all extremities X 4  Post vital signs: Reviewed and stable  Complications: No apparent anesthesia complications

## 2012-01-02 NOTE — Anesthesia Preprocedure Evaluation (Addendum)
Anesthesia Evaluation  Patient identified by MRN, date of birth, ID band Patient awake    Reviewed: Allergy & Precautions, H&P , NPO status , Patient's Chart, lab work & pertinent test results  Airway Mallampati: II TM Distance: >3 FB Neck ROM: Full    Dental  (+) Teeth Intact and Dental Advisory Given   Pulmonary shortness of breath and with exertion, COPD COPD inhaler, PE breath sounds clear to auscultation        Cardiovascular hypertension, Pt. on medications +CHF + dysrhythmias Atrial Fibrillation + pacemaker Rhythm:Irregular Rate:Normal     Neuro/Psych Anxiety    GI/Hepatic Neg liver ROS, GERD-  Medicated,  Endo/Other  negative endocrine ROS  Renal/GU negative Renal ROS     Musculoskeletal   Abdominal   Peds  Hematology negative hematology ROS (+)   Anesthesia Other Findings   Reproductive/Obstetrics                         Anesthesia Physical Anesthesia Plan  ASA: III  Anesthesia Plan: MAC   Post-op Pain Management:    Induction: Intravenous  Airway Management Planned: Mask  Additional Equipment:   Intra-op Plan:   Post-operative Plan: Extubation in OR  Informed Consent: I have reviewed the patients History and Physical, chart, labs and discussed the procedure including the risks, benefits and alternatives for the proposed anesthesia with the patient or authorized representative who has indicated his/her understanding and acceptance.   Dental advisory given  Plan Discussed with: CRNA, Surgeon and Anesthesiologist  Anesthesia Plan Comments:        Anesthesia Quick Evaluation

## 2012-01-02 NOTE — Anesthesia Postprocedure Evaluation (Signed)
  Anesthesia Post-op Note  Patient: [name]  Procedure(s) Performed: Procedure(s) (LRB) with comments: CARDIOVERSION (N/A) - Dr. Graciela Husbands will be coming over to do Cardioversion  Patient Location: PACU and Endoscopy Unit  Anesthesia Type:MAC  Level of Consciousness: awake, alert , oriented and patient cooperative  Airway and Oxygen Therapy: Patient Spontanous Breathing  Post-op Pain: none  Post-op Assessment: Post-op Vital signs reviewed, Patient's Cardiovascular Status Stable, Respiratory Function Stable, Patent Airway and No signs of Nausea or vomiting  Post-op Vital Signs: Reviewed and stable  Complications: No apparent anesthesia complications

## 2012-01-02 NOTE — Op Note (Signed)
   Informed consent for elective DC cardioversion for atrial flutter obtained. Time out done with appropriate identification of patient as well as staff. Anesthesia provided by Dr. Randa Evens. 75 J of current delivered with successful cardioversion to normal sinus rhythm. Pacer interrogation done prior to the procedure as well as afterwards. There were no complications. Spoke to patient after she awoke and she will be discharged this afternoon. Continue current medications and followup in the office with Dr. Graciela Husbands.

## 2012-01-02 NOTE — Anesthesia Postprocedure Evaluation (Signed)
  Anesthesia Post-op Note  Patient: [name]  Procedure(s) Performed: Procedure(s) (LRB) with comments: CARDIOVERSION (N/A) - Dr. Graciela Husbands will be coming over to do Cardioversion  Patient Location: PACU  Anesthesia Type:MAC  Level of Consciousness: awake  Airway and Oxygen Therapy: Patient Spontanous Breathing  Post-op Pain: mild  Post-op Assessment: Post-op Vital signs reviewed  Post-op Vital Signs: Reviewed  Complications: No apparent anesthesia complications

## 2012-01-03 ENCOUNTER — Encounter (HOSPITAL_COMMUNITY): Payer: Self-pay | Admitting: Cardiology

## 2012-01-07 ENCOUNTER — Encounter: Payer: Self-pay | Admitting: *Deleted

## 2012-01-28 ENCOUNTER — Ambulatory Visit (INDEPENDENT_AMBULATORY_CARE_PROVIDER_SITE_OTHER): Payer: Medicare Other | Admitting: *Deleted

## 2012-01-28 DIAGNOSIS — Z7901 Long term (current) use of anticoagulants: Secondary | ICD-10-CM

## 2012-01-28 DIAGNOSIS — I4891 Unspecified atrial fibrillation: Secondary | ICD-10-CM

## 2012-02-10 ENCOUNTER — Telehealth: Payer: Self-pay | Admitting: Internal Medicine

## 2012-02-10 NOTE — Telephone Encounter (Signed)
Pt is very SOB and every now and then some cheat discomfort ans she wants to see Dr. Graciela Husbands

## 2012-02-10 NOTE — Telephone Encounter (Signed)
**Note De-Identified  Obfuscation** Pt. c/o sob, rapid HR and swelling in hands and ankles.  Appointment scheduled with Tereso Newcomer, PA-C for 12/31 (tomorrow) @ 9:10, pt. Aware. Pt. advised that if her symptoms worsen to call 911, she verbalized understanding.

## 2012-02-11 ENCOUNTER — Ambulatory Visit (INDEPENDENT_AMBULATORY_CARE_PROVIDER_SITE_OTHER): Payer: Medicare Other | Admitting: Physician Assistant

## 2012-02-11 ENCOUNTER — Encounter: Payer: Self-pay | Admitting: *Deleted

## 2012-02-11 ENCOUNTER — Ambulatory Visit (INDEPENDENT_AMBULATORY_CARE_PROVIDER_SITE_OTHER): Payer: Medicare Other | Admitting: *Deleted

## 2012-02-11 ENCOUNTER — Encounter: Payer: Self-pay | Admitting: Physician Assistant

## 2012-02-11 VITALS — BP 144/98 | HR 114 | Ht 62.0 in | Wt 130.0 lb

## 2012-02-11 DIAGNOSIS — I4891 Unspecified atrial fibrillation: Secondary | ICD-10-CM

## 2012-02-11 DIAGNOSIS — I4892 Unspecified atrial flutter: Secondary | ICD-10-CM

## 2012-02-11 DIAGNOSIS — Z950 Presence of cardiac pacemaker: Secondary | ICD-10-CM

## 2012-02-11 DIAGNOSIS — I509 Heart failure, unspecified: Secondary | ICD-10-CM

## 2012-02-11 DIAGNOSIS — I10 Essential (primary) hypertension: Secondary | ICD-10-CM

## 2012-02-11 DIAGNOSIS — Z7901 Long term (current) use of anticoagulants: Secondary | ICD-10-CM

## 2012-02-11 MED ORDER — FUROSEMIDE 20 MG PO TABS: 20.0000 mg | ORAL_TABLET | ORAL | Status: DC

## 2012-02-11 MED ORDER — DILTIAZEM HCL ER COATED BEADS 180 MG PO CP24: 180.0000 mg | ORAL_CAPSULE | Freq: Every day | ORAL | Status: DC

## 2012-02-11 NOTE — Progress Notes (Signed)
[address] [place], [place]  [address] Phone: [phone], Fax:  [phone]  Date:  02/11/2012   Name:  [name]   DOB:  Feb 07, 1924   MRN:  [id number]  PCP:  Sid Falcon, MD  Primary Cardiologist:  Dr. Sherryl Manges    History of Present Illness: [name] is a 78 y.o. female who returns for evaluation of palpitations and dyspnea.  She has a history of recurrent atrial arrhythmias. She has failed therapy with flecainide, Multaq, propafenone, sotalol and amiodarone. She remains on chronic Coumadin. She had mitral valve repair at [facility] in 2002. She apparently had no CAD by cardiac catheterization prior to her surgery. She is status post pacemaker secondary to bradycardia. Other history includes HTN, GERD, breast cancer, depression and COPD. TEE 10/12: EF 55-60% .  Last seen by Dr. Graciela Husbands 11/13. She was noted to be in atrial flutter at that time and she was quite symptomatic. Patient was noted to have symptoms of heart failure in relation to rapid ventricular rate. Patient was treated with low-dose Lasix and set up for cardioversion. She underwent cardioversion 01/02/12.  She had done well after her cardioversion until 3-4 days ago. She was on vacation in [place]. She has chronic dyspnea from COPD. She noted her dyspnea is somewhat worse. She notes increased ankle edema. She also notes increased palpitations. She feels quite fatigued. She denies chest pain or syncope. She denies orthopnea, PND.  Labs (11/13):   K 4, creatinine 0.7, Hgb 15.1, TSH 1.11  Wt Readings from Last 3 Encounters:  02/11/12 130 lb (58.968 kg)  12/31/11 129 lb 3.2 oz (58.605 kg)  12/24/11 127 lb (57.607 kg)     Past Medical History  Diagnosis Date  . ABNORMAL ELECTROCARDIOGRAM   . ACID REFLUX DISEASE   . ANEMIA-NOS   . Atrial fibrillation   . BREAST CANCER   . CHF   . COPD   . DEPRESSION/ANXIETY   . DIVERTICULITIS, HX OF   . DYSGEUSIA   . Essential hypertension,  benign   . FATIGUE   . GLAUCOMA   . Shortness of breath   . Pacemaker   . LYMPHEDEMA, RIGHT ARM     Current Outpatient Prescriptions  Medication Sig Dispense Refill  . brimonidine (ALPHAGAN) 0.15 % ophthalmic solution Place 1 drop into both eyes 2 (two) times daily.        . clonazePAM (KLONOPIN) 1 MG tablet Take 1 mg by mouth daily as needed. For anxiety      . Docusate Sodium (DULCOLAX STOOL SOFTENER PO) Take by mouth.      . timolol (BETIMOL) 0.5 % ophthalmic solution Place 1 drop into both eyes daily.       Marland Kitchen tiotropium (SPIRIVA) 18 MCG inhalation capsule Place 18 mcg into inhaler and inhale daily.       Marland Kitchen warfarin (COUMADIN) 5 MG tablet take 1 tablet by mouth once daily or as directed BY COUMADIN CLINIC  30 tablet  3  . zolpidem (AMBIEN) 10 MG tablet Take 10 mg by mouth at bedtime as needed. For sleep      . furosemide (LASIX) 20 MG tablet Take 1 tablet (20 mg total) by mouth daily.  7 tablet  0  . ibuprofen (ADVIL,MOTRIN) 200 MG tablet Take 400 mg by mouth every 6 (six) hours as needed. For pain.       . vitamin B-12 (CYANOCOBALAMIN) 1000 MCG tablet Take 1,000 mcg by mouth daily.       . [  DISCONTINUED] sertraline (ZOLOFT) 25 MG tablet 1/2 tablet for 7 days, then one tab daily  30 tablet  1    Allergies: Allergies  Allergen Reactions  . Citalopram Hydrobromide Other (See Comments)    Causes depression  . Codeine     REACTION: Nausea and cold sweats  . Cymbalta (Duloxetine Hcl) Other (See Comments)    Nausea nervousness  . Multaq (Dronedarone Hydrochloride) Other (See Comments)    unknown  . Pristiq (Desvenlafaxine Succinate Monohydrate) Other (See Comments)    palpitations  . Propafenone Hcl     REACTION: Reaction not known  . Sertraline Other (See Comments)    nervouseness  . Tramadol Other (See Comments)    unknown    Social History:  The patient  reports that she has never smoked. She has never used smokeless tobacco. She reports that she drinks about 2 ounces of  alcohol per week. She reports that she does not use illicit drugs.   ROS:  Please see the history of present illness.   She denies melena, hematochezia, hematuria, hematemesis.   All other systems reviewed and negative.   PHYSICAL EXAM: VS:  BP 144/98  Pulse 114  Ht 5\' 2"  (1.575 m)  Wt 130 lb (58.968 kg)  BMI 23.78 kg/m2  SpO2 97% Well nourished, well developed, in no acute distress HEENT: normal Neck: Minimally elevated JVD Cardiac:  normal S1, S2; irregularly irregular rhythm Lungs:  clear to auscultation bilaterally, no wheezing, rhonchi or rales Abd: soft, nontender, no hepatomegaly Ext: Trace to 1+ bilateral ankle edema Skin: warm and dry Neuro:  CNs 2-12 intact, no focal abnormalities   EKG:  Atrial flutter with variable AV conduction, HR 114      ASSESSMENT AND PLAN:  1. Atrial Flutter with RVR:   She is back in atrial flutter. Heart rate is uncontrolled. She is quite symptomatic. She also has symptoms of CHF with recurrent atrial arrhythmias with RVR. I will place her back on Cardizem CD 180 mg daily for rate control. I will place her back on Lasix 20 mg every other day for volume control. Plan on checking a basic metabolic panel in one week. I discussed her case with Dr. Antoine Poche today (DOD). For the short-term, we will plan on proceeding with cardioversion to restore normal sinus rhythm and alleviate her symptoms. She is unable to do that this week. We will check her INR today. She will return next week to see me on a day that Dr. Graciela Husbands is in the office. If she remains in atrial flutter at that time with therapeutic INRs, we will proceed with cardioversion the next day.  Long term, she will need follow up with Dr. Sherryl Manges to decide on rhythm control.   Lab Results  Component Value Date   INR 1.3 02/11/2012   INR 2.2 01/28/2012   INR 2.7 12/31/2011   Unfortunately her INR is sub-therapeutic today.  If she continues to have significant symptoms next week, we could  consider TEE-DCCV.   2. Hypertension:   Add diltiazem as noted.  3. CHF:  She has a hx of a/c diastolic CHF with AFib/Flutter with RVR.  Minimal signs of volume overload on exam.  Restart Lasix at 20 mg QOD.  F/u BMET next week.   Luna Glasgow, PA-C  9:48 AM 02/11/2012

## 2012-02-11 NOTE — Patient Instructions (Addendum)
Your physician has recommended that you have a Cardioversion (DCCV). Electrical Cardioversion uses a jolt of electricity to your heart either through paddles or wired patches attached to your chest. This is a controlled, usually prescheduled, procedure. Defibrillation is done under light anesthesia in the hospital, and you usually go home the day of the procedure. This is done to get your heart back into a normal rhythm. You are not awake for the procedure. Please see the instruction sheet given to you today.  INR will be done today and when you come back Feb 18, 2012  Lab 02-18-12 (BMET)  START Cardizem CD 180 mg Daily  Change Lasix to 20 mg Daily  Follow up with Dr. Graciela Husbands First available

## 2012-02-17 ENCOUNTER — Telehealth: Payer: Self-pay | Admitting: Internal Medicine

## 2012-02-17 NOTE — Telephone Encounter (Signed)
Pt c/o red and swollen ankles with rash and itching since starting Diltiazem last week.  Advised pt to try Benadryl for itching and rash.  Pt requesting to see Dr. Graciela Husbands instead of [place] on Wednesday.  Appt made.

## 2012-02-17 NOTE — Telephone Encounter (Signed)
Pt having swelling in ankles and hands, itching all over sporadically since taking diltiazem, pls call to advise [phone] also took it last  year and doesn't know why scott would give it to her again when she had this same reaction then, I told it wasn't on her allergy list

## 2012-02-18 ENCOUNTER — Ambulatory Visit: Payer: Medicare Other | Admitting: Physician Assistant

## 2012-02-19 ENCOUNTER — Ambulatory Visit (INDEPENDENT_AMBULATORY_CARE_PROVIDER_SITE_OTHER): Payer: Medicare Other | Admitting: Pharmacist

## 2012-02-19 ENCOUNTER — Other Ambulatory Visit (INDEPENDENT_AMBULATORY_CARE_PROVIDER_SITE_OTHER): Payer: Medicare Other

## 2012-02-19 ENCOUNTER — Ambulatory Visit: Payer: Medicare Other | Admitting: Physician Assistant

## 2012-02-19 ENCOUNTER — Ambulatory Visit (INDEPENDENT_AMBULATORY_CARE_PROVIDER_SITE_OTHER): Payer: Medicare Other | Admitting: Internal Medicine

## 2012-02-19 ENCOUNTER — Encounter: Payer: Self-pay | Admitting: Internal Medicine

## 2012-02-19 VITALS — BP 132/90 | HR 89 | Ht 62.0 in | Wt 131.0 lb

## 2012-02-19 DIAGNOSIS — Z950 Presence of cardiac pacemaker: Secondary | ICD-10-CM

## 2012-02-19 DIAGNOSIS — R0989 Other specified symptoms and signs involving the circulatory and respiratory systems: Secondary | ICD-10-CM

## 2012-02-19 DIAGNOSIS — I509 Heart failure, unspecified: Secondary | ICD-10-CM

## 2012-02-19 DIAGNOSIS — I4891 Unspecified atrial fibrillation: Secondary | ICD-10-CM

## 2012-02-19 DIAGNOSIS — Z7901 Long term (current) use of anticoagulants: Secondary | ICD-10-CM

## 2012-02-19 DIAGNOSIS — I4892 Unspecified atrial flutter: Secondary | ICD-10-CM

## 2012-02-19 LAB — PACEMAKER DEVICE OBSERVATION
BAMS-0001: 150 {beats}/min
BAMS-0003: 75 {beats}/min
BRDY-0002RV: 75 {beats}/min
BRDY-0003RV: 120 {beats}/min
RV LEAD AMPLITUDE: 11.8 mv
RV LEAD THRESHOLD: 0.75 V

## 2012-02-19 LAB — POCT INR: INR: 4.1

## 2012-02-19 MED ORDER — VERAPAMIL HCL ER 240 MG PO TBCR: 240.0000 mg | EXTENDED_RELEASE_TABLET | Freq: Every day | ORAL | Status: DC

## 2012-02-19 MED ORDER — FUROSEMIDE 20 MG PO TABS: 20.0000 mg | ORAL_TABLET | ORAL | Status: DC

## 2012-02-19 NOTE — Patient Instructions (Signed)
Your physician has recommended you make the following change in your medication:  1) stop diltiazem 2) Start verapamil 240 mg one tablet by mouth once daily.

## 2012-02-19 NOTE — Assessment & Plan Note (Signed)
The patient's device was interrogated.  The information was reviewed. No changes were made in the programming.    

## 2012-02-19 NOTE — Progress Notes (Signed)
Patient Care Team: Michael Kalish as PCP - General (Family Medicine)   HPI  [name] is a 79 y.o. female Seen in followup for recurrence of atrial arrhythmias. 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 We recently attempted cardioversion in the context of flecainide therapy; unfortunately she has failed and has reverted to atrial arrhythmia. She is also failed sotalol and amiodarone also was intolerable secondary to tremor    drugs have not been a very good option. Her age makes pulmonary vein isolation not a very good option  She was last cardioverted in November; hold sinus rhythm for 3 or 4 weeks and then reverted with recurring symptoms and a rapid ventricular rate.  She has had significant dyspnea and some edema improved with dilt started by SW in late DEC but assoc with itching and some edema--prev described as allergic   Echocardiogram 8/11 demonstrated normal left ventricular function   Past Medical History  Diagnosis Date  . ABNORMAL ELECTROCARDIOGRAM   . ACID REFLUX DISEASE   . ANEMIA-NOS   . Atrial fibrillation   . BREAST CANCER   . CHF   . COPD   . DEPRESSION/ANXIETY   . DIVERTICULITIS, HX OF   . DYSGEUSIA   . Essential hypertension, benign   . FATIGUE   . GLAUCOMA   . Shortness of breath   . Pacemaker   . LYMPHEDEMA, RIGHT ARM     Past Surgical History  Procedure Date  . Mitral valve repair 2002  . Esophagogastrodenoscopy   . Partial colectomy   . Shoulder surgery   . Mastectomy     right breast   . Cardioversion 01/21/2011    Procedure: CARDIOVERSION;  Surgeon: Steven C Klein, MD;  Location: [facility] OR;  Service: Cardiovascular;  Laterality: N/A;  OUT PATIENT CARDIOVERSION  . Cardioversion 05/16/2011    Procedure: CARDIOVERSION;  Surgeon: Steven C Klein, MD;  Location: [facility] OR;  Service: Cardiovascular;  Laterality: N/A;  . Pacemaker insertion 2012  . Cardioversion 01/02/2012    Procedure: CARDIOVERSION;   Surgeon: Thomas C Wall, MD;  Location: [facility] ENDOSCOPY;  Service: Cardiovascular;  Laterality: N/A;  Dr. Klein will be coming over to do Cardioversion    Current Outpatient Prescriptions  Medication Sig Dispense Refill  . brimonidine (ALPHAGAN) 0.15 % ophthalmic solution Place 1 drop into both eyes 2 (two) times daily.        . clonazePAM (KLONOPIN) 1 MG tablet Take 1 mg by mouth daily as needed. For anxiety      . diltiazem (CARDIZEM CD) 180 MG 24 hr capsule Take 1 capsule (180 mg total) by mouth daily.  30 capsule  11  . Docusate Sodium (DULCOLAX STOOL SOFTENER PO) Take by mouth.      . ibuprofen (ADVIL,MOTRIN) 200 MG tablet Take 400 mg by mouth every 6 (six) hours as needed. For pain.       . timolol (BETIMOL) 0.5 % ophthalmic solution Place 1 drop into both eyes daily.       . tiotropium (SPIRIVA) 18 MCG inhalation capsule Place 18 mcg into inhaler and inhale daily.       . vitamin B-12 (CYANOCOBALAMIN) 1000 MCG tablet Take 1,000 mcg by mouth daily.       . warfarin (COUMADIN) 5 MG tablet take 1 tablet by mouth once daily or as directed BY COUMADIN CLINIC  30 tablet  3  . zolpidem (AMBIEN) 10 MG tablet Take 10 mg by mouth at bedtime   as needed. For sleep      . furosemide (LASIX) 20 MG tablet Take 1 tablet (20 mg total) by mouth every other day.  30 tablet  11  . [DISCONTINUED] sertraline (ZOLOFT) 25 MG tablet 1/2 tablet for 7 days, then one tab daily  30 tablet  1    Allergies  Allergen Reactions  . Citalopram Hydrobromide Other (See Comments)    Causes depression  . Codeine     REACTION: Nausea and cold sweats  . Cymbalta (Duloxetine Hcl) Other (See Comments)    Nausea nervousness  . Multaq (Dronedarone Hydrochloride) Other (See Comments)    unknown  . Pristiq (Desvenlafaxine Succinate Monohydrate) Other (See Comments)    palpitations  . Propafenone Hcl     REACTION: Reaction not known  . Sertraline Other (See Comments)    nervouseness  . Tramadol Other (See Comments)    unknown     Review of Systems negative except from HPI and PMH  Physical Exam BP 132/90  Pulse 89  Ht 5' 2" (1.575 m)  Wt 131 lb (59.421 kg)  BMI 23.96 kg/m2 Well developed and nourished in no acute distress HENT normal Neck supple with JVP-flat Carotids brisk and full without bruits Clear Irregularly irregular rate and rhythm with rapid ventricular response, no murmurs or gallops Abd-soft with active BS without hepatomegaly No Clubbing cyanosis tr edema Skin-warm and dry A & Oriented  Grossly normal sensory and motor function  ecg >>AF  overall ICD     Assessment and  Plan  

## 2012-02-19 NOTE — Assessment & Plan Note (Addendum)
She has failed multiple medications. She is not holding cardioversion. She is evidence of diastolic heart failure. I don't think she is a good candidate for PCI. I've recommended that we pursue AV junction ablation and will discuss it with her family. I told her that 85-90% of people should be better or much better. Using her diuretic will also help given her HFpEF  Have discussed with pts daughter

## 2012-02-19 NOTE — Assessment & Plan Note (Signed)
As above.

## 2012-02-19 NOTE — Assessment & Plan Note (Signed)
Reasonable control. 

## 2012-02-20 ENCOUNTER — Encounter (HOSPITAL_COMMUNITY): Payer: Self-pay | Admitting: *Deleted

## 2012-02-20 ENCOUNTER — Ambulatory Visit (HOSPITAL_COMMUNITY): Admission: RE | Admit: 2012-02-20 | Payer: Medicare Other | Source: Ambulatory Visit | Admitting: Cardiology

## 2012-02-20 ENCOUNTER — Encounter (HOSPITAL_COMMUNITY): Admission: RE | Payer: Self-pay | Source: Ambulatory Visit

## 2012-02-20 SURGERY — CARDIOVERSION
Anesthesia: Monitor Anesthesia Care

## 2012-02-21 ENCOUNTER — Telehealth: Payer: Self-pay | Admitting: Internal Medicine

## 2012-02-21 NOTE — Telephone Encounter (Signed)
Daughter has question regarding ablation procedure that is going to be done

## 2012-02-21 NOTE — Telephone Encounter (Signed)
Will forward to Dr. Klein. 

## 2012-02-21 NOTE — Telephone Encounter (Signed)
[name]  She would like to proceed so call next week when you can and set up av ablations thnask steve

## 2012-02-24 ENCOUNTER — Telehealth: Payer: Self-pay | Admitting: Internal Medicine

## 2012-02-24 DIAGNOSIS — I4891 Unspecified atrial fibrillation: Secondary | ICD-10-CM

## 2012-02-25 ENCOUNTER — Ambulatory Visit (INDEPENDENT_AMBULATORY_CARE_PROVIDER_SITE_OTHER): Payer: Medicare Other | Admitting: *Deleted

## 2012-02-25 DIAGNOSIS — I4891 Unspecified atrial fibrillation: Secondary | ICD-10-CM

## 2012-02-25 NOTE — Telephone Encounter (Signed)
I spoke with the patient. She will call and let me know, but we will look at doing her ablation on 1/20 with Dr. Graciela Husbands. She reports edema, itching and redness with starting her verapamil. Per Dr. Graciela Husbands, she can resume diltiazem or just stop both altogether pending her av node ablation. The patient has opted to stop both.

## 2012-02-25 NOTE — Telephone Encounter (Signed)
New Problem:    Patient called in wanted to speak with you about a rapid heart beat and the scheduling for a procedure she has coming up, also is having some swelling.  Please call back.

## 2012-02-25 NOTE — Telephone Encounter (Signed)
Another phone encounter has been opened with another complaint. Will work off that encounter and close this one.

## 2012-02-25 NOTE — Telephone Encounter (Signed)
I spoke with the patient. She is coming today for lab work around 2 pm. I will talk to her at that time and try to arrange her procedure.

## 2012-02-26 ENCOUNTER — Telehealth: Payer: Self-pay | Admitting: Internal Medicine

## 2012-02-26 ENCOUNTER — Encounter (HOSPITAL_COMMUNITY): Payer: Self-pay

## 2012-02-26 ENCOUNTER — Encounter: Payer: Self-pay | Admitting: *Deleted

## 2012-02-26 ENCOUNTER — Other Ambulatory Visit: Payer: Self-pay | Admitting: Internal Medicine

## 2012-02-26 DIAGNOSIS — I4891 Unspecified atrial fibrillation: Secondary | ICD-10-CM

## 2012-02-26 LAB — PROTIME-INR: INR: 4.1 ratio — ABNORMAL HIGH (ref 0.8–1.0)

## 2012-02-26 LAB — CBC WITH DIFFERENTIAL/PLATELET
Basophils Absolute: 0.1 10*3/uL (ref 0.0–0.1)
Lymphocytes Relative: 22.7 % (ref 12.0–46.0)
Lymphs Abs: 1.5 10*3/uL (ref 0.7–4.0)
Monocytes Relative: 10.9 % (ref 3.0–12.0)
Neutrophils Relative %: 63.1 % (ref 43.0–77.0)
Platelets: 184 10*3/uL (ref 150.0–400.0)
RDW: 14.5 % (ref 11.5–14.6)

## 2012-02-26 LAB — BASIC METABOLIC PANEL
CO2: 18 mEq/L — ABNORMAL LOW (ref 19–32)
Calcium: 9.7 mg/dL (ref 8.4–10.5)
Chloride: 102 mEq/L (ref 96–112)
Sodium: 133 mEq/L — ABNORMAL LOW (ref 135–145)

## 2012-02-26 NOTE — Telephone Encounter (Signed)
called to see why pt did not come in for her bmet yesterday per call from Inez in the lab, pt missed her previous lab appt. I will call pt later again today

## 2012-02-26 NOTE — Telephone Encounter (Signed)
I called the patient regarding her lab work from yesterday. Per Dr. Graciela Husbands, she will start holding her warfarin today. She will have a repeat bmp on Monday when she goes in for her procedure. I will mail a copy of her instructions to her. Her address is [address], [place], [place] [address].

## 2012-02-26 NOTE — Telephone Encounter (Signed)
Pt calling back re a message she got from carol that she didn't have her blood work done, but she came in yesterday, can you let her know if there is anything else she needs to do?

## 2012-02-26 NOTE — Telephone Encounter (Signed)
New Problem:    Patient called in wanting to receive her instructions for her upcomming procedure.  Please call back.

## 2012-02-26 NOTE — Telephone Encounter (Signed)
New problem :   Very confused regarding x 2 phone call she receive today regarding blood work.

## 2012-02-27 ENCOUNTER — Telehealth: Payer: Self-pay | Admitting: Internal Medicine

## 2012-02-27 NOTE — Telephone Encounter (Signed)
New problem:    Spoke with nurse on yesterday calling back to discuss medication.

## 2012-02-27 NOTE — Telephone Encounter (Signed)
Pt was called, she requested to wait till tomorrow when nurse is back to continue their conversation, msg forwarded to nurse.

## 2012-02-28 NOTE — Telephone Encounter (Signed)
[name] More Detail >>      [name]      Sent: Caleen Essex February 28, 2012 10:14 AM    To: Jefferey Pica, RN        [name]    MRN: [id number] DOB: Oct 26, 1923     Pt Home: [phone]               Message     Can you call her back at [phone]          Regarding her procedure - she is nervous and wants to know if she can take clonazepam or something of that nature.         I spoke with the patient and made her aware that it is ok to take clonazepam if she needs to. She verbalizes understanding.

## 2012-03-02 ENCOUNTER — Ambulatory Visit (HOSPITAL_COMMUNITY)
Admission: RE | Admit: 2012-03-02 | Discharge: 2012-03-03 | Disposition: A | Discharge status: Home / Self Care | Payer: Medicare Other | Source: Ambulatory Visit | Attending: Internal Medicine | Admitting: Internal Medicine

## 2012-03-02 ENCOUNTER — Encounter (HOSPITAL_COMMUNITY): Payer: Self-pay | Admitting: *Deleted

## 2012-03-02 ENCOUNTER — Encounter (HOSPITAL_COMMUNITY)
Admission: RE | Disposition: A | Discharge status: Home / Self Care | Payer: Self-pay | Source: Ambulatory Visit | Attending: Internal Medicine

## 2012-03-02 DIAGNOSIS — Z950 Presence of cardiac pacemaker: Secondary | ICD-10-CM

## 2012-03-02 DIAGNOSIS — I4891 Unspecified atrial fibrillation: Secondary | ICD-10-CM | POA: Insufficient documentation

## 2012-03-02 DIAGNOSIS — I4892 Unspecified atrial flutter: Secondary | ICD-10-CM

## 2012-03-02 DIAGNOSIS — J4489 Other specified chronic obstructive pulmonary disease: Secondary | ICD-10-CM | POA: Insufficient documentation

## 2012-03-02 DIAGNOSIS — I509 Heart failure, unspecified: Secondary | ICD-10-CM | POA: Insufficient documentation

## 2012-03-02 DIAGNOSIS — Z7901 Long term (current) use of anticoagulants: Secondary | ICD-10-CM | POA: Insufficient documentation

## 2012-03-02 DIAGNOSIS — I498 Other specified cardiac arrhythmias: Secondary | ICD-10-CM | POA: Insufficient documentation

## 2012-03-02 DIAGNOSIS — J449 Chronic obstructive pulmonary disease, unspecified: Secondary | ICD-10-CM | POA: Insufficient documentation

## 2012-03-02 DIAGNOSIS — I10 Essential (primary) hypertension: Secondary | ICD-10-CM | POA: Insufficient documentation

## 2012-03-02 HISTORY — PX: AV NODE ABLATION: SHX5458

## 2012-03-02 LAB — BASIC METABOLIC PANEL
BUN: 13 mg/dL (ref 6–23)
Creatinine, Ser: 0.62 mg/dL (ref 0.50–1.10)
GFR calc Af Amer: >90 mL/min (ref >90)
GFR calc non Af Amer: 79 mL/min — ABNORMAL LOW (ref >90)
Potassium: 4.3 mEq/L (ref 3.5–5.1)

## 2012-03-02 LAB — PROTIME-INR: Prothrombin Time: 14.3 seconds (ref 11.6–15.2)

## 2012-03-02 SURGERY — AV NODE ABLATION
Anesthesia: LOCAL

## 2012-03-02 MED ORDER — CLONAZEPAM 0.5 MG PO TABS
1.0000 mg | ORAL_TABLET | Freq: Two times a day (BID) | ORAL | Status: DC | PRN
  Administered 2012-03-03: 1 mg via ORAL
  Filled 2012-03-02: qty 2

## 2012-03-02 MED ORDER — SODIUM CHLORIDE 0.9 % IJ SOLN: 3.0000 mL | Freq: Two times a day (BID) | INTRAMUSCULAR | Status: DC

## 2012-03-02 MED ORDER — OFF THE BEAT BOOK
Freq: Once | Status: AC
  Administered 2012-03-03: 05:00:00
  Filled 2012-03-02: qty 1

## 2012-03-02 MED ORDER — WARFARIN SODIUM 5 MG PO TABS
5.0000 mg | ORAL_TABLET | ORAL | Status: DC
  Filled 2012-03-02: qty 1

## 2012-03-02 MED ORDER — BRIMONIDINE TARTRATE 0.2 % OP SOLN
1.0000 [drp] | Freq: Two times a day (BID) | OPHTHALMIC | Status: DC
  Filled 2012-03-02: qty 5

## 2012-03-02 MED ORDER — ONDANSETRON HCL 4 MG/2ML IJ SOLN: 4.0000 mg | Freq: Four times a day (QID) | INTRAMUSCULAR | Status: DC | PRN

## 2012-03-02 MED ORDER — DOCUSATE SODIUM 50 MG PO CAPS
50.0000 mg | ORAL_CAPSULE | Freq: Every day | ORAL | Status: DC
  Filled 2012-03-02: qty 1

## 2012-03-02 MED ORDER — WARFARIN SODIUM 2.5 MG PO TABS: 2.5000 mg | ORAL_TABLET | ORAL | Status: DC

## 2012-03-02 MED ORDER — BRIMONIDINE TARTRATE 0.2 % OP SOLN
1.0000 [drp] | Freq: Two times a day (BID) | OPHTHALMIC | Status: DC
  Administered 2012-03-03: 10:00:00 1 [drp] via OPHTHALMIC
  Filled 2012-03-02: qty 5

## 2012-03-02 MED ORDER — FENTANYL CITRATE 0.05 MG/ML IJ SOLN
INTRAMUSCULAR | Status: AC
  Filled 2012-03-02: qty 2

## 2012-03-02 MED ORDER — MIDAZOLAM HCL 5 MG/5ML IJ SOLN
INTRAMUSCULAR | Status: AC
  Filled 2012-03-02: qty 5

## 2012-03-02 MED ORDER — WARFARIN - PHYSICIAN DOSING INPATIENT: Freq: Every day | Status: DC

## 2012-03-02 MED ORDER — ALUM & MAG HYDROXIDE-SIMETH 200-200-20 MG/5ML PO SUSP
30.0000 mL | ORAL | Status: DC | PRN
  Administered 2012-03-02: 22:00:00 30 mL via ORAL
  Filled 2012-03-02: qty 30

## 2012-03-02 MED ORDER — DOCUSATE SODIUM 50 MG PO CAPS
50.0000 mg | ORAL_CAPSULE | Freq: Every day | ORAL | Status: DC
Start: 2012-03-02 — End: 2012-03-03
  Administered 2012-03-02 – 2012-03-03 (×2): 50 mg via ORAL
  Filled 2012-03-02 (×2): qty 1

## 2012-03-02 MED ORDER — TIOTROPIUM BROMIDE MONOHYDRATE 18 MCG IN CAPS
18.0000 ug | ORAL_CAPSULE | Freq: Every day | RESPIRATORY_TRACT | Status: DC
  Administered 2012-03-03: 10:00:00 18 ug via RESPIRATORY_TRACT
  Filled 2012-03-02: qty 5

## 2012-03-02 MED ORDER — SODIUM CHLORIDE 0.9 % IJ SOLN
3.0000 mL | Freq: Two times a day (BID) | INTRAMUSCULAR | Status: DC
  Administered 2012-03-02: 21:00:00 3 mL via INTRAVENOUS

## 2012-03-02 MED ORDER — CLONAZEPAM 0.5 MG PO TABS
1.0000 mg | ORAL_TABLET | Freq: Two times a day (BID) | ORAL | Status: DC | PRN
  Administered 2012-03-02: 14:00:00 1 mg via ORAL
  Filled 2012-03-02: qty 2

## 2012-03-02 MED ORDER — FUROSEMIDE 20 MG PO TABS
20.0000 mg | ORAL_TABLET | ORAL | Status: DC
  Filled 2012-03-02: qty 1

## 2012-03-02 MED ORDER — SODIUM CHLORIDE 0.45 % IV SOLN
INTRAVENOUS | Status: DC
  Administered 2012-03-02: 1000 mL via INTRAVENOUS

## 2012-03-02 MED ORDER — TIMOLOL HEMIHYDRATE 0.5 % OP SOLN
1.0000 [drp] | Freq: Every day | OPHTHALMIC | Status: DC
  Filled 2012-03-02 (×4): qty 0.1

## 2012-03-02 MED ORDER — VITAMIN B-12 1000 MCG PO TABS
1000.0000 ug | ORAL_TABLET | Freq: Every day | ORAL | Status: DC
  Administered 2012-03-02 – 2012-03-03 (×2): 1000 ug via ORAL
  Filled 2012-03-02 (×2): qty 1

## 2012-03-02 MED ORDER — TIOTROPIUM BROMIDE MONOHYDRATE 18 MCG IN CAPS
18.0000 ug | ORAL_CAPSULE | Freq: Every day | RESPIRATORY_TRACT | Status: DC
  Filled 2012-03-02: qty 5

## 2012-03-02 MED ORDER — SODIUM CHLORIDE 0.9 % IJ SOLN: 3.0000 mL | INTRAMUSCULAR | Status: DC | PRN

## 2012-03-02 MED ORDER — ZOLPIDEM TARTRATE 5 MG PO TABS: 10.0000 mg | ORAL_TABLET | Freq: Every day | ORAL | Status: DC

## 2012-03-02 MED ORDER — WARFARIN SODIUM 5 MG PO TABS
5.0000 mg | ORAL_TABLET | Freq: Once | ORAL | Status: AC
  Administered 2012-03-02: 5 mg via ORAL
  Filled 2012-03-02: qty 1

## 2012-03-02 MED ORDER — ACETAMINOPHEN 325 MG PO TABS: 650.0000 mg | ORAL_TABLET | ORAL | Status: DC | PRN

## 2012-03-02 MED ORDER — HEPARIN (PORCINE) IN NACL 2-0.9 UNIT/ML-% IJ SOLN
INTRAMUSCULAR | Status: AC
  Filled 2012-03-02: qty 500

## 2012-03-02 MED ORDER — TIMOLOL HEMIHYDRATE 0.5 % OP SOLN: 1.0000 [drp] | Freq: Every day | OPHTHALMIC | Status: DC

## 2012-03-02 MED ORDER — VITAMIN B-12 1000 MCG PO TABS
1000.0000 ug | ORAL_TABLET | Freq: Every day | ORAL | Status: DC
  Filled 2012-03-02: qty 1

## 2012-03-02 MED ORDER — SODIUM CHLORIDE 0.9 % IV SOLN: INTRAVENOUS | Status: DC

## 2012-03-02 MED ORDER — WARFARIN SODIUM 2.5 MG PO TABS: 2.5000 mg | ORAL_TABLET | Freq: Every day | ORAL | Status: DC

## 2012-03-02 MED ORDER — SODIUM CHLORIDE 0.9 % IV SOLN: 250.0000 mL | INTRAVENOUS | Status: DC | PRN

## 2012-03-02 MED ORDER — ZOLPIDEM TARTRATE 5 MG PO TABS
5.0000 mg | ORAL_TABLET | Freq: Every day | ORAL | Status: DC
  Administered 2012-03-02: 5 mg via ORAL
  Filled 2012-03-02: qty 1

## 2012-03-02 MED ORDER — TIMOLOL MALEATE 0.5 % OP SOLN
1.0000 [drp] | Freq: Every day | OPHTHALMIC | Status: DC
  Administered 2012-03-03: 1 [drp] via OPHTHALMIC
  Filled 2012-03-02: qty 5

## 2012-03-02 MED ORDER — BUPIVACAINE HCL (PF) 0.25 % IJ SOLN
INTRAMUSCULAR | Status: AC
  Filled 2012-03-02: qty 60

## 2012-03-02 NOTE — Progress Notes (Signed)
Pt having pacer spikes after the QRS.  Dr. Terressa Koyanagi informed, no new orders received, will continue to monitor patient.

## 2012-03-02 NOTE — Progress Notes (Signed)
Utilization Review Completed Aadvik Roker J. Shawnese Magner, RN, BSN, NCM [phone]  

## 2012-03-02 NOTE — H&P (View-Only) (Signed)
Patient Care Team: Loyal Jacobson as PCP - General (Family Medicine)   HPI  [name] is a 79 y.o. female Seen in followup for recurrence of atrial arrhythmias. 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 We recently attempted cardioversion in the context of flecainide therapy; unfortunately she has failed and has reverted to atrial arrhythmia. She is also failed sotalol and amiodarone also was intolerable secondary to tremor    drugs have not been a very good option. Her age makes pulmonary vein isolation not a very good option  She was last cardioverted in November; hold sinus rhythm for 3 or 4 weeks and then reverted with recurring symptoms and a rapid ventricular rate.  She has had significant dyspnea and some edema improved with dilt started by SW in late DEC but assoc with itching and some edema--prev described as allergic   Echocardiogram 8/11 demonstrated normal left ventricular function   Past Medical History  Diagnosis Date  . ABNORMAL ELECTROCARDIOGRAM   . ACID REFLUX DISEASE   . ANEMIA-NOS   . Atrial fibrillation   . BREAST CANCER   . CHF   . COPD   . DEPRESSION/ANXIETY   . DIVERTICULITIS, HX OF   . DYSGEUSIA   . Essential hypertension, benign   . FATIGUE   . GLAUCOMA   . Shortness of breath   . Pacemaker   . LYMPHEDEMA, RIGHT ARM     Past Surgical History  Procedure Date  . Mitral valve repair 2002  . Esophagogastrodenoscopy   . Partial colectomy   . Shoulder surgery   . Mastectomy     right breast   . Cardioversion 01/21/2011    Procedure: CARDIOVERSION;  Surgeon: Duke Salvia, MD;  Location: [facility] OR;  Service: Cardiovascular;  Laterality: N/A;  OUT PATIENT CARDIOVERSION  . Cardioversion 05/16/2011    Procedure: CARDIOVERSION;  Surgeon: Duke Salvia, MD;  Location: [facility] OR;  Service: Cardiovascular;  Laterality: N/A;  . Pacemaker insertion 2012  . Cardioversion 01/02/2012    Procedure: CARDIOVERSION;   Surgeon: Gaylord Shih, MD;  Location: [facility] ENDOSCOPY;  Service: Cardiovascular;  Laterality: N/A;  Dr. Graciela Husbands will be coming over to do Cardioversion    Current Outpatient Prescriptions  Medication Sig Dispense Refill  . brimonidine (ALPHAGAN) 0.15 % ophthalmic solution Place 1 drop into both eyes 2 (two) times daily.        . clonazePAM (KLONOPIN) 1 MG tablet Take 1 mg by mouth daily as needed. For anxiety      . diltiazem (CARDIZEM CD) 180 MG 24 hr capsule Take 1 capsule (180 mg total) by mouth daily.  30 capsule  11  . Docusate Sodium (DULCOLAX STOOL SOFTENER PO) Take by mouth.      Marland Kitchen ibuprofen (ADVIL,MOTRIN) 200 MG tablet Take 400 mg by mouth every 6 (six) hours as needed. For pain.       Marland Kitchen timolol (BETIMOL) 0.5 % ophthalmic solution Place 1 drop into both eyes daily.       Marland Kitchen tiotropium (SPIRIVA) 18 MCG inhalation capsule Place 18 mcg into inhaler and inhale daily.       . vitamin B-12 (CYANOCOBALAMIN) 1000 MCG tablet Take 1,000 mcg by mouth daily.       Marland Kitchen warfarin (COUMADIN) 5 MG tablet take 1 tablet by mouth once daily or as directed BY COUMADIN CLINIC  30 tablet  3  . zolpidem (AMBIEN) 10 MG tablet Take 10 mg by mouth at bedtime  as needed. For sleep      . furosemide (LASIX) 20 MG tablet Take 1 tablet (20 mg total) by mouth every other day.  30 tablet  11  . [DISCONTINUED] sertraline (ZOLOFT) 25 MG tablet 1/2 tablet for 7 days, then one tab daily  30 tablet  1    Allergies  Allergen Reactions  . Citalopram Hydrobromide Other (See Comments)    Causes depression  . Codeine     REACTION: Nausea and cold sweats  . Cymbalta (Duloxetine Hcl) Other (See Comments)    Nausea nervousness  . Multaq (Dronedarone Hydrochloride) Other (See Comments)    unknown  . Pristiq (Desvenlafaxine Succinate Monohydrate) Other (See Comments)    palpitations  . Propafenone Hcl     REACTION: Reaction not known  . Sertraline Other (See Comments)    nervouseness  . Tramadol Other (See Comments)    unknown     Review of Systems negative except from HPI and PMH  Physical Exam BP 132/90  Pulse 89  Ht 5\' 2"  (1.575 m)  Wt 131 lb (59.421 kg)  BMI 23.96 kg/m2 Well developed and nourished in no acute distress HENT normal Neck supple with JVP-flat Carotids brisk and full without bruits Clear Irregularly irregular rate and rhythm with rapid ventricular response, no murmurs or gallops Abd-soft with active BS without hepatomegaly No Clubbing cyanosis tr edema Skin-warm and dry A & Oriented  Grossly normal sensory and motor function  ecg >>AF  overall ICD     Assessment and  Plan

## 2012-03-02 NOTE — Progress Notes (Signed)
Patient remains on 6500. 4700 notified that patient will not be transferring. Bed not needed for cath patient.

## 2012-03-02 NOTE — Interval H&P Note (Signed)
History and Physical Interval Note:  03/02/2012 7:44 AM  [name]  has presented today for surgery, with the diagnosis of Afib  The various methods of treatment have been discussed with the patient and family. After consideration of risks, benefits and other options for treatment, the patient has consented to  Procedure(s) (LRB) with comments: AV NODE ABLATION (N/A) as a surgical intervention .  The patient's history has been reviewed, patient examined, no change in status, stable for surgery.  I have reviewed the patient's chart and labs.  Questions were answered to the patient's satisfaction.     Sherryl Manges  revioewed with family

## 2012-03-02 NOTE — Progress Notes (Signed)
Reported to [facility] 4700 RN.

## 2012-03-02 NOTE — Op Note (Signed)
[name], [name] NO.:  [id number]  MEDICAL RECORD NO.:  [id number]  LOCATION:  [facility]                         FACILITY:  [facility]  PHYSICIAN:  Duke Salvia, MD, FACCDATE OF BIRTH:  [birth year]  DATE OF PROCEDURE:  03/02/2012 DATE OF DISCHARGE:                              OPERATIVE REPORT   PREOPERATIVE DIAGNOSIS:  Atrial flutter with uncontrolled ventricular response.  POSTOPERATIVE DIAGNOSIS:  Atrial flutter with uncontrolled ventricular response.  PROCEDURE:  AV junction ablation, and His bundle measurement, and device interrogation.  Following obtaining informed consent, the patient was brought to the electrophysiology laboratory and placed on the fluoroscopic table in supine position.  After routine prep and drape, cardiac catheterization was performed with local anesthesia conscious sedation.  Noninvasive blood pressure monitoring, transcutaneous oxygen saturation monitoring were performed continuously throughout the procedure.  Following the procedure, the catheters were removed.  Hemostasis was obtained.  The patient was transferred to the holding area in stable condition.  CATHETER:  A 7-French 4 mm deflectable tip catheter was inserted via the right femoral vein to mapping sites in the AV junction.  RESULTS:  End-tidal surface electrocardiogram: Rhythm:  Atrial flutter. Cycle length is 460 milliseconds. HV interval was 37 milliseconds.  Post ablation, there was no intrinsic rhythm.  The QT interval is 408 milliseconds.  The rhythm was paced at 90 beats per minute.  Fluoroscopy time, a total of 4.5 minutes of fluoroscopy time was utilized at 15 frames per second.  RF energy:  Two applications of RF energy were applied for a total of about 35-40 seconds, data are pending.  The device was reprogrammed to a VVI mode at 90 beats per minute. Ventricular ectopy was noted at slower heart rates.  IMPRESSION:  Successful AV junction ablation and  pacemaker re- interrogation.     Duke Salvia, MD, [facility]     SCK/MEDQ  D:  03/02/2012  T:  03/02/2012  Job:  [id number]

## 2012-03-02 NOTE — CV Procedure (Signed)
[name] [id number]  [id number]  Preop Dx: AFlutter uncontrolled  VR` Postop Dx same/  Procedure:His measurement and AV aablation     Cx: None   Dictation number [id number]  Sherryl Manges, MD 03/02/2012 9:08 AM

## 2012-03-03 ENCOUNTER — Encounter (HOSPITAL_COMMUNITY): Payer: Self-pay | Admitting: *Deleted

## 2012-03-03 DIAGNOSIS — I4892 Unspecified atrial flutter: Secondary | ICD-10-CM

## 2012-03-03 LAB — PROTIME-INR
INR: 1.01 (ref 0.00–1.49)
Prothrombin Time: 13.2 seconds (ref 11.6–15.2)

## 2012-03-03 NOTE — Discharge Summary (Signed)
ELECTROPHYSIOLOGY DISCHARGE SUMMARY    Patient ID: [name],  MRN: [id number], DOB/AGE: 10-25-1923 79 y.o.  Admit date: 03/02/2012 Discharge date: 03/03/2012  Primary Care Physician: Loyal Jacobson, MD Primary Cardiologist: Berton Mount, MD  Primary Discharge Diagnosis:  1. Recurrent atrial flutter s/p AV node ablation 2. Bradycardia s/p PPM implant previously  Secondary Discharge Diagnoses:  1. Atrial fibrillation 2. s/p MV repair 3. COPD 4. HTN 5. GERD 6. Glaucoma 7. Anemia 8. Breast CA s/p mastectomy  Procedures This Admission:  1. AV junction ablation, His bundle measurement and device interrogation RESULTS:  Rhythm: Atrial flutter.  Cycle length is 460 milliseconds.  HV interval was 37 milliseconds.  Post ablation, there was no intrinsic rhythm. The QT interval is 408 milliseconds. The rhythm was paced at 90 beats per minute. Fluoroscopy time, a total of 4.5 minutes of fluoroscopy time was utilized at 15 frames per second.  RF energy: Two applications of RF energy were applied for a total of about 35-40 seconds  The device was reprogrammed to a VVI mode at 90 beats per minute. Ventricular ectopy was noted at slower heart rates.  IMPRESSION:  Successful AV junction ablation and pacemaker re-interrogation.   History and Hospital Course:  [name] is a pleasant 79 year old woman with recurrent atrial arrhythmias despite multiple AADs +DCCV and bradycardia s/p PPM previously who presented yesterday for AV node ablation for symptomatic relief of recurrent atrial flutter. She tolerated this procedure well without any immediate complication. She remains hemodynamically stable and afebrile. Telemetry shows V paced rhythm at 90 bpm with occasional PVCs. Her groin site is intact without significant bleeding or hematoma. She has been given discharge instructions including wound care and activity restrictions. She will follow-up in clinic in 6 weeks. She has been seen,  examined and deemed stable for discharge today by Dr. Berton Mount.  Physical Exam: Vitals: Blood pressure 151/83, pulse 91, temperature 97.8 F (36.6 C), temperature source Oral, resp. rate 27, height 5\' 2"  (1.575 m), weight 135 lb 5.8 oz (61.4 kg), SpO2 96.00%.  General: Well developed, well appearing 79 year old female in no acute distress. Heart: RRR. S1, S2 present. No murmur, rub, S3 or S4. Lungs: CTA bilaterally. No wheezes, rales or rhonchi. Abdomen: Soft, nondistended. Extremities: No cyanosis, clubbing or edema. Pedal pulses present and equal bilaterally. Groin site intact without significant bleeding or hematoma. Neuro: Alert and oriented x 3. No focal deficits.  Labs: Lab Results  Component Value Date   WBC 6.5 02/25/2012   HGB 15.9* 02/25/2012   HCT 46.0 02/25/2012   MCV 98.7 02/25/2012   PLT 184.0 02/25/2012     Lab 03/02/12 0640  NA 137  K 4.3  CL 103  CO2 24  BUN 13  CREATININE 0.62  CALCIUM 9.6  PROT --  BILITOT --  ALKPHOS --  ALT --  AST --  GLUCOSE 97    Basename 03/03/12 0445  INR 1.01    Disposition:  The patient is being discharged in stable condition.  Follow-up:     Follow-up Information    Follow up with [facility]. On 03/06/2012. (At 10:45 AM for Coumadin follow-up)    Contact information:   [address] [place] [address] [phone]      Follow up with Sherryl Manges, MD. On 04/01/2012. (At 2:30 PM)    Contact information:   [address] [place] [address] [phone]  Discharge Medications:    Medication List     As of 03/03/2012  9:04 AM    TAKE these medications         AMBIEN 10 MG tablet   Generic drug: zolpidem   Take 10 mg by mouth at bedtime.      brimonidine 0.15 % ophthalmic solution   Commonly known as: ALPHAGAN   Place 1 drop into both eyes 2 (two) times daily.      clonazePAM 1 MG tablet   Commonly known as: KLONOPIN   Take 1 mg by mouth  daily as needed. For anxiety      DULCOLAX STOOL SOFTENER PO   Take 1 capsule by mouth daily.      furosemide 20 MG tablet   Commonly known as: LASIX   Take 1 tablet (20 mg total) by mouth every other day.      ibuprofen 200 MG tablet   Commonly known as: ADVIL,MOTRIN   Take 400 mg by mouth every 6 (six) hours as needed. For pain.      timolol 0.5 % ophthalmic solution   Commonly known as: BETIMOL   Place 1 drop into both eyes daily.      tiotropium 18 MCG inhalation capsule   Commonly known as: SPIRIVA   Place 18 mcg into inhaler and inhale daily.      vitamin B-12 1000 MCG tablet   Commonly known as: CYANOCOBALAMIN   Take 1,000 mcg by mouth daily.      warfarin 5 MG tablet   Commonly known as: COUMADIN   Take 2.5-5 mg by mouth daily. 1/2 tablet Friday and 1 whole tablet rest of week        Duration of Discharge Encounter: Greater than 30 minutes including physician time.  Signed, EDMISTEN, Nehemiah Settle, PA-C 03/03/2012, 9:04 AM Pacer clinic 2 weeks  HR 90>80 and actiavate rate response Pacer clinic 4 weeks HR80>70 Coumadin clinic next week  Berton Mount

## 2012-03-10 ENCOUNTER — Telehealth: Payer: Self-pay | Admitting: Internal Medicine

## 2012-03-10 NOTE — Telephone Encounter (Signed)
I spoke with the patient. She states that she has had continued SOB since her ablation on 1/20. She states this is no different than her SOB prior to her procedure. She states she feels like she is in rhythm today. I explained to her that she may still have some symptoms until the site of the ablation scars over completely. She is due to come in on 2/5 for her device to be reprogrammed. I have advised her to keep that appointment and to call should her SOB get worse. She voices understanding.

## 2012-03-10 NOTE — Telephone Encounter (Signed)
Pt had an ablation done and now having SOB, pls call

## 2012-03-13 ENCOUNTER — Ambulatory Visit (INDEPENDENT_AMBULATORY_CARE_PROVIDER_SITE_OTHER): Payer: Medicare Other

## 2012-03-13 ENCOUNTER — Ambulatory Visit (INDEPENDENT_AMBULATORY_CARE_PROVIDER_SITE_OTHER): Payer: Medicare Other | Admitting: *Deleted

## 2012-03-13 ENCOUNTER — Other Ambulatory Visit (INDEPENDENT_AMBULATORY_CARE_PROVIDER_SITE_OTHER): Payer: Medicare Other

## 2012-03-13 ENCOUNTER — Other Ambulatory Visit: Payer: Self-pay | Admitting: *Deleted

## 2012-03-13 ENCOUNTER — Telehealth: Payer: Self-pay | Admitting: *Deleted

## 2012-03-13 DIAGNOSIS — I4891 Unspecified atrial fibrillation: Secondary | ICD-10-CM

## 2012-03-13 DIAGNOSIS — R0602 Shortness of breath: Secondary | ICD-10-CM

## 2012-03-13 DIAGNOSIS — Z7901 Long term (current) use of anticoagulants: Secondary | ICD-10-CM

## 2012-03-13 LAB — BASIC METABOLIC PANEL
BUN: 20 mg/dL (ref 6–23)
Calcium: 9.7 mg/dL (ref 8.4–10.5)
GFR: 71.19 mL/min (ref >60.00)
Potassium: 5.1 mEq/L (ref 3.5–5.1)
Sodium: 136 mEq/L (ref 135–145)

## 2012-03-13 LAB — POCT INR: INR: 3.5

## 2012-03-13 NOTE — Telephone Encounter (Signed)
Patient was in the office today for a coumadin check. She complains of increased SOB. In listening to her apical pulse, she does sound like she is in regular rhythm around 100 bpm. She is not on any meds for rate control. I discussed with Dr. Graciela Husbands. He wants the patient's device checked today - set her rate to 80 and turn on rate response. He would also like a bmp/ bnp drawn. Sherri Rad, RN, BSN  Kristin saw the patient and adjusted her device. Labs were drawn. The patient was instructed to call back next week if she does not feel any better. Sherri Rad, RN, BSN

## 2012-03-16 LAB — PACEMAKER DEVICE OBSERVATION
BRDY-0002RV: 90 {beats}/min
DEVICE MODEL PM: 7209799
RV LEAD AMPLITUDE: 6.4 mv
RV LEAD THRESHOLD: 0.75 V

## 2012-03-16 NOTE — Progress Notes (Signed)
Pacer check in clinic  

## 2012-03-19 ENCOUNTER — Telehealth: Payer: Self-pay | Admitting: *Deleted

## 2012-03-19 ENCOUNTER — Encounter: Payer: Self-pay | Admitting: Internal Medicine

## 2012-03-19 ENCOUNTER — Ambulatory Visit (INDEPENDENT_AMBULATORY_CARE_PROVIDER_SITE_OTHER): Payer: Medicare Other | Admitting: Internal Medicine

## 2012-03-19 VITALS — BP 136/84 | HR 83 | Ht 62.0 in | Wt 125.0 lb

## 2012-03-19 DIAGNOSIS — Z950 Presence of cardiac pacemaker: Secondary | ICD-10-CM

## 2012-03-19 DIAGNOSIS — I5032 Chronic diastolic (congestive) heart failure: Secondary | ICD-10-CM

## 2012-03-19 DIAGNOSIS — I4891 Unspecified atrial fibrillation: Secondary | ICD-10-CM

## 2012-03-19 LAB — PACEMAKER DEVICE OBSERVATION
AL THRESHOLD: 0.75 V
RV LEAD IMPEDENCE PM: 475 Ohm
RV LEAD THRESHOLD: 0.75 V

## 2012-03-19 NOTE — Assessment & Plan Note (Signed)
S/p Av ablation we have reprogrammed the device to a lower rate at a lower rate response. Hopefully this will help with her dyspnea. Her BMP a couple of weeks ago was mildly elevated. We will have her take her Lasix daily and let us know next week with her breathlessness is any better. I will discuss with Dr. Dorthea Cove whether she would be a candidate for CPX.

## 2012-03-19 NOTE — Assessment & Plan Note (Signed)
The patient's device was interrogated and the information was fully reviewed.  The device was reprogrammed as above. 

## 2012-03-19 NOTE — Assessment & Plan Note (Signed)
As above.

## 2012-03-19 NOTE — Patient Instructions (Signed)
Your physician has requested that you have an echocardiogram. Echocardiography is a painless test that uses sound waves to create images of your heart. It provides your doctor with information about the size and shape of your heart and how well your heart's chambers and valves are working. This procedure takes approximately one hour. There are no restrictions for this procedure.  Your physician has recommended you make the following change in your medication:  1) Take furosemide one tablet daily

## 2012-03-19 NOTE — Progress Notes (Signed)
Patient Care Team: Loyal Jacobson as PCP - General (Family Medicine)   HPI  [name] is a 79 y.o. female With chronic shortness of breath who underwent AV junction ablation for atrial fibrillation with a rapid rate that was uncontrollable. She continues to have complaints of significant shortness of breath which may or may not be worse. It is also been a significant overlay of worsening depression.  She notes that her heart rates have been faster with walking up 220, this is confirmed by her daughter. There've been no significant edema.  Past Medical History  Diagnosis Date  . ABNORMAL ELECTROCARDIOGRAM   . ACID REFLUX DISEASE   . ANEMIA-NOS   . Atrial fibrillation   . BREAST CANCER   . CHF   . COPD   . DEPRESSION/ANXIETY   . DIVERTICULITIS, HX OF   . DYSGEUSIA   . Essential hypertension, benign   . FATIGUE   . GLAUCOMA   . Shortness of breath   . Pacemaker   . LYMPHEDEMA, RIGHT ARM     Past Surgical History  Procedure Date  . Mitral valve repair 2002  . Esophagogastrodenoscopy   . Partial colectomy   . Shoulder surgery   . Mastectomy     right breast   . Cardioversion 01/21/2011    Procedure: CARDIOVERSION;  Surgeon: Duke Salvia, MD;  Location: [facility] OR;  Service: Cardiovascular;  Laterality: N/A;  OUT PATIENT CARDIOVERSION  . Cardioversion 05/16/2011    Procedure: CARDIOVERSION;  Surgeon: Duke Salvia, MD;  Location: [facility] OR;  Service: Cardiovascular;  Laterality: N/A;  . Pacemaker insertion 2012  . Cardioversion 01/02/2012    Procedure: CARDIOVERSION;  Surgeon: Gaylord Shih, MD;  Location: [facility] ENDOSCOPY;  Service: Cardiovascular;  Laterality: N/A;  Dr. Graciela Husbands will be coming over to do Cardioversion    Current Outpatient Prescriptions  Medication Sig Dispense Refill  . brimonidine (ALPHAGAN) 0.15 % ophthalmic solution Place 1 drop into both eyes 2 (two) times daily.        . clonazePAM (KLONOPIN) 1 MG tablet Take 1 mg by mouth daily as needed. For anxiety       . Docusate Sodium (DULCOLAX STOOL SOFTENER PO) Take 1 capsule by mouth daily.       . furosemide (LASIX) 20 MG tablet Take 20 mg by mouth every other day. PRN      . ibuprofen (ADVIL,MOTRIN) 200 MG tablet Take 400 mg by mouth every 6 (six) hours as needed. For pain.      Marland Kitchen timolol (BETIMOL) 0.5 % ophthalmic solution Place 1 drop into both eyes daily.       Marland Kitchen tiotropium (SPIRIVA) 18 MCG inhalation capsule Place 18 mcg into inhaler and inhale daily.       . vitamin B-12 (CYANOCOBALAMIN) 1000 MCG tablet Take 1,000 mcg by mouth daily.       Marland Kitchen warfarin (COUMADIN) 5 MG tablet Take 2.5-5 mg by mouth daily. 1/2 tablet Friday and 1 whole tablet rest of week      . zolpidem (AMBIEN) 10 MG tablet Take 10 mg by mouth at bedtime.       . [DISCONTINUED] sertraline (ZOLOFT) 25 MG tablet 1/2 tablet for 7 days, then one tab daily  30 tablet  1    Allergies  Allergen Reactions  . Citalopram Hydrobromide Other (See Comments)    Causes depression  . Codeine     REACTION: Nausea and cold sweats  . Cymbalta (Duloxetine Hcl) Other (See Comments)  Nausea nervousness  . Diltiazem Other (See Comments)    Doesn't remember  . Multaq (Dronedarone Hydrochloride) Other (See Comments)    unknown  . Pristiq (Desvenlafaxine Succinate Monohydrate) Other (See Comments)    palpitations  . Propafenone Hcl     REACTION: Reaction not known  . Sertraline Other (See Comments)    nervouseness  . Tramadol Other (See Comments)    unknown    Review of Systems negative except from HPI and PMH  Physical Exam BP 136/84  Pulse 83  Ht 5\' 2"  (1.575 m)  Wt 125 lb (56.7 kg)  BMI 22.86 kg/m2 Well developed and well nourished in no acute distress HENT normal E scleral and icterus clear Neck Supple JVP  9-10cm ; carotids brisk and full Clear to ausculation  Regular rate and rhythm, no murmurs gallops or rub Soft with active bowel sounds No clubbing cyanosis none Edema Alert and oriented, grossly normal motor and  sensory function Skin Warm and Dry    Assessment and  Plan

## 2012-03-19 NOTE — Telephone Encounter (Signed)
The patient called this morning with continued complaints of heart racing and SOB. I have added her on to Dr. [name] schedule today at 12:30 pm.

## 2012-03-24 ENCOUNTER — Ambulatory Visit (INDEPENDENT_AMBULATORY_CARE_PROVIDER_SITE_OTHER): Payer: Medicare Other

## 2012-03-24 ENCOUNTER — Ambulatory Visit (HOSPITAL_COMMUNITY): Payer: Medicare Other | Attending: Cardiology

## 2012-03-24 DIAGNOSIS — I080 Rheumatic disorders of both mitral and aortic valves: Secondary | ICD-10-CM | POA: Insufficient documentation

## 2012-03-24 DIAGNOSIS — R0609 Other forms of dyspnea: Secondary | ICD-10-CM | POA: Insufficient documentation

## 2012-03-24 DIAGNOSIS — I10 Essential (primary) hypertension: Secondary | ICD-10-CM | POA: Insufficient documentation

## 2012-03-24 DIAGNOSIS — I509 Heart failure, unspecified: Secondary | ICD-10-CM | POA: Insufficient documentation

## 2012-03-24 DIAGNOSIS — I079 Rheumatic tricuspid valve disease, unspecified: Secondary | ICD-10-CM | POA: Insufficient documentation

## 2012-03-24 DIAGNOSIS — I5032 Chronic diastolic (congestive) heart failure: Secondary | ICD-10-CM

## 2012-03-24 DIAGNOSIS — I4891 Unspecified atrial fibrillation: Secondary | ICD-10-CM

## 2012-03-24 DIAGNOSIS — R0989 Other specified symptoms and signs involving the circulatory and respiratory systems: Secondary | ICD-10-CM | POA: Insufficient documentation

## 2012-03-24 DIAGNOSIS — J4489 Other specified chronic obstructive pulmonary disease: Secondary | ICD-10-CM | POA: Insufficient documentation

## 2012-03-24 DIAGNOSIS — J449 Chronic obstructive pulmonary disease, unspecified: Secondary | ICD-10-CM | POA: Insufficient documentation

## 2012-03-24 DIAGNOSIS — R5381 Other malaise: Secondary | ICD-10-CM | POA: Insufficient documentation

## 2012-03-24 DIAGNOSIS — Z7901 Long term (current) use of anticoagulants: Secondary | ICD-10-CM

## 2012-03-24 LAB — POCT INR: INR: 2.5

## 2012-03-24 NOTE — Progress Notes (Signed)
Echocardiogram performed.  

## 2012-04-01 ENCOUNTER — Telehealth: Payer: Self-pay | Admitting: Internal Medicine

## 2012-04-01 ENCOUNTER — Encounter: Payer: Medicare Other | Admitting: Internal Medicine

## 2012-04-01 NOTE — Telephone Encounter (Signed)
I left a message of Pre-lim results on the patient's identified voice mail.

## 2012-04-01 NOTE — Telephone Encounter (Signed)
Follow Up    Pt calling to follow up on results from test last week.

## 2012-04-10 ENCOUNTER — Telehealth: Payer: Self-pay | Admitting: Internal Medicine

## 2012-04-10 ENCOUNTER — Encounter: Payer: Self-pay | Admitting: Internal Medicine

## 2012-04-10 NOTE — Telephone Encounter (Signed)
New Prob    Experiencing chest pain, was severe last night. No nitro taken/needed. Would like to speak to nurse.

## 2012-04-10 NOTE — Telephone Encounter (Signed)
Called patient back. She has reported having an episode of chest pain with tachycardia lasting 2 to 3 hours last night Finally went to sleep but feeling somewhat nervous today. Advised that she can take her nerve pill. Discussed with Gunnar Fusi (pacer nurse). Patient to transmit a MERLIN check over the phone. Gunnar Fusi will call her with any abnormalities and patient  will keep appointment with Dr.Klein next week.

## 2012-04-15 ENCOUNTER — Encounter: Payer: Self-pay | Admitting: Internal Medicine

## 2012-04-15 ENCOUNTER — Ambulatory Visit (INDEPENDENT_AMBULATORY_CARE_PROVIDER_SITE_OTHER): Payer: Medicare Other | Admitting: *Deleted

## 2012-04-15 ENCOUNTER — Ambulatory Visit (INDEPENDENT_AMBULATORY_CARE_PROVIDER_SITE_OTHER): Payer: Medicare Other | Admitting: Internal Medicine

## 2012-04-15 VITALS — BP 129/85 | HR 70 | Ht 62.0 in | Wt 123.0 lb

## 2012-04-15 DIAGNOSIS — Z7901 Long term (current) use of anticoagulants: Secondary | ICD-10-CM

## 2012-04-15 DIAGNOSIS — M810 Age-related osteoporosis without current pathological fracture: Secondary | ICD-10-CM

## 2012-04-15 DIAGNOSIS — I4891 Unspecified atrial fibrillation: Secondary | ICD-10-CM

## 2012-04-15 DIAGNOSIS — R079 Chest pain, unspecified: Secondary | ICD-10-CM | POA: Insufficient documentation

## 2012-04-15 DIAGNOSIS — E785 Hyperlipidemia, unspecified: Secondary | ICD-10-CM

## 2012-04-15 DIAGNOSIS — R0602 Shortness of breath: Secondary | ICD-10-CM

## 2012-04-15 LAB — POCT INR: INR: 3.3

## 2012-04-15 LAB — PACEMAKER DEVICE OBSERVATION
BRDY-0004RV: 100 {beats}/min
DEVICE MODEL PM: 7209799
VENTRICULAR PACING PM: 77

## 2012-04-15 LAB — LDL CHOLESTEROL, DIRECT: Direct LDL: 150.4 mg/dL

## 2012-04-15 LAB — LIPID PANEL
Cholesterol: 226 mg/dL — ABNORMAL HIGH (ref 0–200)
Triglycerides: 113 mg/dL (ref 0.0–149.0)

## 2012-04-15 LAB — BASIC METABOLIC PANEL
Calcium: 9.6 mg/dL (ref 8.4–10.5)
Creatinine, Ser: 0.7 mg/dL (ref 0.4–1.2)
GFR: 87.1 mL/min (ref >60.00)
Sodium: 141 mEq/L (ref 135–145)

## 2012-04-15 NOTE — Patient Instructions (Addendum)
Your physician recommends that you schedule a follow-up appointment in: 6 months with Dr Graciela Husbands  Your physician has requested that you have a lexiscan myoview. For further information please visit [email or web address] Please follow instruction sheet, as given.  Your physician recommends that you return for lab work drawn today (BNP and Troponin) (for [facility] - BMP, Lipid and Vit D)

## 2012-04-15 NOTE — Assessment & Plan Note (Addendum)
The patient's device was interrogated.  The information was reviewed. We will increase his slope of her rate response and decrease his thresholds.

## 2012-04-15 NOTE — Assessment & Plan Note (Signed)
The patient has typical sounding chest pain occurring at rest. It is of prolonged duration. We will check a troponin; given the duration of over 30 minutes it should be positive this is cardiac. I think it is unlikely to be positive. However, given also the dyspnea on exertion we'll undertake alexia skin edges this was 10 years this evaluation been taken for cardiac perfusion

## 2012-04-15 NOTE — Assessment & Plan Note (Signed)
We discussed extensively the issues of dyspnea with exertion. She has COPD which is potentially contributing. She also has chronotropic incompetence with only 4% of her heart beats R. Faster than 80 beats per minute. . We will reprogram her device. A trial of diuretics had no impact on her respirations 2 weeks ago.

## 2012-04-15 NOTE — Progress Notes (Signed)
Patient Care Team: Loyal Jacobson as PCP - General (Family Medicine)   HPI  [name] is a 79 y.o. female With chronic shortness of breath who underwent AV junction ablation for atrial fibrillation with a rapid rate that was uncontrollable. She continues to have complaints of significant shortness of breath which may or may not be worse. It is also been a significant overlay of worsening depression.   She continues to struggle with significant shortness of breath with exertion.  She also describes chest discomfort as a pressure sensation moving into her neck. It is unrelated to exertion. It lasts 30-45 minutes. There are no aggravating or relieving factors apart from lying down. There is no associated brackish taste. Interval echo demonstrated normal left ventricular function and only mild postsurgical MR    Past Medical History  Diagnosis Date  . ABNORMAL ELECTROCARDIOGRAM   . ACID REFLUX DISEASE   . ANEMIA-NOS   . Atrial fibrillation   . BREAST CANCER   . CHF   . COPD   . DEPRESSION/ANXIETY   . DIVERTICULITIS, HX OF   . DYSGEUSIA   . Essential hypertension, benign   . FATIGUE   . GLAUCOMA   . Shortness of breath   . Pacemaker   . LYMPHEDEMA, RIGHT ARM     Past Surgical History  Procedure Laterality Date  . Mitral valve repair  2002  . Esophagogastrodenoscopy    . Partial colectomy    . Shoulder surgery    . Mastectomy      right breast   . Cardioversion  01/21/2011    Procedure: CARDIOVERSION;  Surgeon: Duke Salvia, MD;  Location: [facility] OR;  Service: Cardiovascular;  Laterality: N/A;  OUT PATIENT CARDIOVERSION  . Cardioversion  05/16/2011    Procedure: CARDIOVERSION;  Surgeon: Duke Salvia, MD;  Location: [facility] OR;  Service: Cardiovascular;  Laterality: N/A;  . Pacemaker insertion  2012  . Cardioversion  01/02/2012    Procedure: CARDIOVERSION;  Surgeon: Gaylord Shih, MD;  Location: [facility] ENDOSCOPY;  Service: Cardiovascular;  Laterality: N/A;  Dr. Graciela Husbands will be  coming over to do Cardioversion    Current Outpatient Prescriptions  Medication Sig Dispense Refill  . brimonidine (ALPHAGAN) 0.15 % ophthalmic solution Place 1 drop into both eyes 2 (two) times daily.        . clonazePAM (KLONOPIN) 1 MG tablet Take 1 mg by mouth daily as needed. For anxiety      . Docusate Sodium (DULCOLAX STOOL SOFTENER PO) Take 1 capsule by mouth daily.       . furosemide (LASIX) 20 MG tablet Take one tablet by mouth daily      . ibuprofen (ADVIL,MOTRIN) 200 MG tablet Take 400 mg by mouth every 6 (six) hours as needed. For pain.      Marland Kitchen timolol (BETIMOL) 0.5 % ophthalmic solution Place 1 drop into both eyes daily.       Marland Kitchen tiotropium (SPIRIVA) 18 MCG inhalation capsule Place 18 mcg into inhaler and inhale daily.       . vitamin B-12 (CYANOCOBALAMIN) 1000 MCG tablet Take 1,000 mcg by mouth daily.       Marland Kitchen warfarin (COUMADIN) 5 MG tablet Take 2.5-5 mg by mouth daily. 1/2 tablet Friday and 1 whole tablet rest of week      . zolpidem (AMBIEN) 10 MG tablet Take 10 mg by mouth at bedtime.       . [DISCONTINUED] sertraline (ZOLOFT) 25 MG tablet 1/2 tablet for 7 days,  then one tab daily  30 tablet  1   No current facility-administered medications for this visit.    Allergies  Allergen Reactions  . Citalopram Hydrobromide Other (See Comments)    Causes depression  . Codeine     REACTION: Nausea and cold sweats  . Cymbalta (Duloxetine Hcl) Other (See Comments)    Nausea nervousness  . Diltiazem Other (See Comments)    Doesn't remember  . Multaq (Dronedarone Hydrochloride) Other (See Comments)    unknown  . Pristiq (Desvenlafaxine Succinate Monohydrate) Other (See Comments)    palpitations  . Propafenone Hcl     REACTION: Reaction not known  . Sertraline Other (See Comments)    nervouseness  . Tramadol Other (See Comments)    unknown    Review of Systems negative except from HPI and PMH  Physical Exam BP 129/85  Pulse 70  Ht 5\' 2"  (1.575 m)  Wt 123 lb (55.792 kg)   BMI 22.49 kg/m2 Well developed and well nourished in no acute distress HENT normal E scleral and icterus clear Neck Supple JVP 8-10t; carotids brisk and full Clear to ausculation  Regular rate and rhythm, no murmurs gallops or rub Soft with active bowel sounds No clubbing cyanosis none  Edema Alert and oriented, grossly normal motor and sensory function Skin Warm and Dry    Assessment and  Plan

## 2012-04-16 LAB — VITAMIN D 25 HYDROXY (VIT D DEFICIENCY, FRACTURES): Vit D, 25-Hydroxy: 30 ng/mL (ref 30–89)

## 2012-04-20 ENCOUNTER — Other Ambulatory Visit: Payer: Self-pay

## 2012-04-20 MED ORDER — WARFARIN SODIUM 5 MG PO TABS: ORAL_TABLET | ORAL | Status: DC

## 2012-04-23 ENCOUNTER — Telehealth: Payer: Self-pay | Admitting: Internal Medicine

## 2012-04-23 NOTE — Telephone Encounter (Signed)
Returned patient's call regarding lab work; patient wanted to verify that Dr. Leonette Most would be able to see labs when she went to her appointment this afternoon.  Patient verified understanding that labs are in Epic and PCP will be able to view.

## 2012-04-23 NOTE — Telephone Encounter (Signed)
Patient aware that her lab work showed that she did not have an MI but labs have not been reviewed by Dr.Klein yet. She is aware that we will call back after lab review by MD.

## 2012-04-23 NOTE — Telephone Encounter (Signed)
New problem:  Test results.  

## 2012-04-24 ENCOUNTER — Other Ambulatory Visit: Payer: Self-pay | Admitting: *Deleted

## 2012-04-24 DIAGNOSIS — R209 Unspecified disturbances of skin sensation: Secondary | ICD-10-CM

## 2012-04-29 ENCOUNTER — Other Ambulatory Visit (INDEPENDENT_AMBULATORY_CARE_PROVIDER_SITE_OTHER): Payer: Medicare Other

## 2012-04-29 ENCOUNTER — Ambulatory Visit (INDEPENDENT_AMBULATORY_CARE_PROVIDER_SITE_OTHER): Payer: Medicare Other | Admitting: *Deleted

## 2012-04-29 ENCOUNTER — Ambulatory Visit (HOSPITAL_COMMUNITY): Payer: Medicare Other | Attending: Cardiology | Admitting: Radiology

## 2012-04-29 VITALS — BP 149/108 | Ht 62.0 in | Wt 123.0 lb

## 2012-04-29 DIAGNOSIS — Z7901 Long term (current) use of anticoagulants: Secondary | ICD-10-CM

## 2012-04-29 DIAGNOSIS — R0789 Other chest pain: Secondary | ICD-10-CM | POA: Insufficient documentation

## 2012-04-29 DIAGNOSIS — E785 Hyperlipidemia, unspecified: Secondary | ICD-10-CM

## 2012-04-29 DIAGNOSIS — I4891 Unspecified atrial fibrillation: Secondary | ICD-10-CM

## 2012-04-29 DIAGNOSIS — J4489 Other specified chronic obstructive pulmonary disease: Secondary | ICD-10-CM | POA: Insufficient documentation

## 2012-04-29 DIAGNOSIS — I509 Heart failure, unspecified: Secondary | ICD-10-CM | POA: Insufficient documentation

## 2012-04-29 DIAGNOSIS — R079 Chest pain, unspecified: Secondary | ICD-10-CM

## 2012-04-29 DIAGNOSIS — R209 Unspecified disturbances of skin sensation: Secondary | ICD-10-CM

## 2012-04-29 DIAGNOSIS — M810 Age-related osteoporosis without current pathological fracture: Secondary | ICD-10-CM

## 2012-04-29 DIAGNOSIS — R0602 Shortness of breath: Secondary | ICD-10-CM | POA: Insufficient documentation

## 2012-04-29 DIAGNOSIS — J449 Chronic obstructive pulmonary disease, unspecified: Secondary | ICD-10-CM | POA: Insufficient documentation

## 2012-04-29 DIAGNOSIS — I447 Left bundle-branch block, unspecified: Secondary | ICD-10-CM

## 2012-04-29 LAB — HEMOGLOBIN A1C: Hgb A1c MFr Bld: 5.7 % (ref 4.6–6.5)

## 2012-04-29 LAB — POCT INR: INR: 1.9

## 2012-04-29 LAB — FOLATE: Folate: 19.8 ng/mL (ref >5.9)

## 2012-04-29 MED ORDER — TECHNETIUM TC 99M SESTAMIBI GENERIC - CARDIOLITE
10.0000 | Freq: Once | INTRAVENOUS | Status: AC | PRN
  Administered 2012-04-29: 10 via INTRAVENOUS

## 2012-04-29 MED ORDER — ADENOSINE (DIAGNOSTIC) 3 MG/ML IV SOLN
0.5600 mg/kg | Freq: Once | INTRAVENOUS | Status: AC
  Administered 2012-04-29: 31.2 mg via INTRAVENOUS

## 2012-04-29 MED ORDER — TECHNETIUM TC 99M SESTAMIBI GENERIC - CARDIOLITE
30.0000 | Freq: Once | INTRAVENOUS | Status: AC | PRN
  Administered 2012-04-29: 30 via INTRAVENOUS

## 2012-04-29 NOTE — Progress Notes (Signed)
[facility] SITE 3 NUCLEAR MED [address] [place], [place] [address] [phone]    Cardiology Nuclear Med Study  [name] is a 79 y.o. female     MRN : [id number]     DOB: 02-06-1924  Procedure Date: 04/29/2012  Nuclear Med Background Indication for Stress Test:  Evaluation for Ischemia History:  COPD and AFIB, CHF, AV-Ablation, '01 Pacemaker, '11 MPS: NL EF: 74%, '13 Cardioversion, 03/24/12 ECHO: EF: 50-55% mild MR, mod TR Cardiac Risk Factors: LBBB  Symptoms:  Chest Pressure, Fatigue and SOB   Nuclear Pre-Procedure Caffeine/Decaff Intake:  None > 12 hrs NPO After: 7:30pm   Lungs:  clear O2 Sat: 96% on room air. IV 0.9% NS with Angio Cath:  24g  IV Site: L Hand x 1, tolerated well  IV Started by:  Irean Hong, RN  Chest Size (in):  40 Cup Size: B  Height: 5\' 2"  (1.575 m)  Weight:  123 lb (55.792 kg)  BMI:  Body mass index is 22.49 kg/(m^2). Tech Comments:  Spiriva this am    Nuclear Med Study 1 or 2 day study: 1 day  Stress Test Type:  Adenosine  Reading MD: Willa Rough, MD  Order Authorizing Provider:  Sherryl Manges, MD  Resting Radionuclide: Technetium 23m Sestamibi  Resting Radionuclide Dose: 11.0 mCi   Stress Radionuclide:  Technetium 19m Sestamibi  Stress Radionuclide Dose: 32.9 mCi           Stress Protocol Rest HR: 73 Stress HR: 83  Rest BP: 149/108 Stress BP: 168/118  Exercise Time (min): n/a METS: n/a   Predicted Max HR: 134 bpm % Max HR: 61.94 bpm Rate Pressure Product: 21308   Dose of Adenosine (mg):  31.3 Dose of Lexiscan: n/a mg  Dose of Atropine (mg): n/a Dose of Dobutamine: n/a mcg/kg/min (at max HR)  Stress Test Technologist: Milana Na, EMT-P  Nuclear Technologist:  Domenic Polite, CNMT     Rest Procedure:  Myocardial perfusion imaging was performed at rest 45 minutes following the intravenous administration of Technetium 60m Sestamibi. Rest ECG:  Paced rhythm  Stress Procedure:  The patient received IV adenosine  at 140 mcg/kg/min for 4 minutes.  This patient had sob with the Adenosine infusionTechnetium 58m Sestamibi was injected at the 2 minute mark and quantitative spect images were obtained after a 45 minute delay. Stress ECG: No significant change from baseline ECG  QPS Raw Data Images:  Normal; no motion artifact; normal heart/lung ratio. Stress Images:  Normal homogeneous uptake in all areas of the myocardium. Rest Images:  Normal homogeneous uptake in all areas of the myocardium. Subtraction (SDS):  No evidence of ischemia. Transient Ischemic Dilatation (Normal <1.22):  1.10 Lung/Heart Ratio (Normal <0.45):  0.31  Quantitative Gated Spect Images QGS EDV:  n/a ml QGS ESV:  n/a ml  Impression Exercise Capacity:  Adenosine study with no exercise. BP Response:  Normal blood pressure response. Clinical Symptoms:  shortness of breath ECG Impression:  No significant ST segment change suggestive of ischemia. Comparison with Prior Nuclear Study: No images to compare  Overall Impression:  The study could not be gated because of the rhythm. The nuclear images are normal. There is no scar or ischemia. This is a low risk scan.  LV Ejection Fraction:       Study not gated.  Therefore ejection fraction is not available.  Willa Rough, MD

## 2012-04-30 ENCOUNTER — Encounter: Payer: Self-pay | Admitting: *Deleted

## 2012-05-05 ENCOUNTER — Telehealth: Payer: Self-pay | Admitting: Internal Medicine

## 2012-05-05 NOTE — Telephone Encounter (Signed)
New Problem:    Called in wanting to know the results of her latest stress test.  Please call back.

## 2012-05-05 NOTE — Telephone Encounter (Signed)
I left a message for the patient.

## 2012-05-07 NOTE — Telephone Encounter (Signed)
I spoke with the patient and made her aware of the prelim results for her myoview. She is also asking for Dr. Graciela Husbands to make recommendations for who he thinks would be good for her to see 1) neurology- leg weakness/"odd" sensation to her feet (I know we or her PCP will have to make a referral for her for this), 2) pulmonary - for COPD. I will forward to Dr. Graciela Husbands for any recommendations regarding her myoview and for recommendations for referrals.

## 2012-05-12 NOTE — Telephone Encounter (Signed)
Forwarding to MD

## 2012-05-13 NOTE — Telephone Encounter (Signed)
.  I would tell her that the Myoview requires no further followup at this point that the results were outstanding. Dr. Craige Cotta or Ramswamy would be potentially good to see her for her COPD

## 2012-05-14 NOTE — Telephone Encounter (Signed)
I left a message that Dr. Graciela Husbands said myoview ok. No recommendations for a neurologist. I suggested she obtain a referral from her PCP to [facility] Neurology. I have given her the recommended names for pulmonary per Dr. Graciela Husbands. This was all left on her identified voice mail.

## 2012-05-18 ENCOUNTER — Ambulatory Visit (INDEPENDENT_AMBULATORY_CARE_PROVIDER_SITE_OTHER): Payer: Medicare Other | Admitting: Pharmacist

## 2012-05-18 DIAGNOSIS — Z7901 Long term (current) use of anticoagulants: Secondary | ICD-10-CM

## 2012-05-18 DIAGNOSIS — I4891 Unspecified atrial fibrillation: Secondary | ICD-10-CM

## 2012-05-18 LAB — POCT INR: INR: 2.7

## 2012-05-20 ENCOUNTER — Other Ambulatory Visit: Payer: Self-pay | Admitting: *Deleted

## 2012-05-20 ENCOUNTER — Telehealth: Payer: Self-pay | Admitting: Internal Medicine

## 2012-05-20 DIAGNOSIS — R29898 Other symptoms and signs involving the musculoskeletal system: Secondary | ICD-10-CM

## 2012-05-20 NOTE — Telephone Encounter (Signed)
New problem    Referral to neuro clinic @ [facility].

## 2012-05-27 ENCOUNTER — Ambulatory Visit: Payer: Medicare Other | Admitting: Neurology

## 2012-06-03 ENCOUNTER — Encounter: Payer: Self-pay | Admitting: Internal Medicine

## 2012-06-03 ENCOUNTER — Ambulatory Visit (INDEPENDENT_AMBULATORY_CARE_PROVIDER_SITE_OTHER): Payer: Medicare Other | Admitting: Internal Medicine

## 2012-06-03 ENCOUNTER — Ambulatory Visit (INDEPENDENT_AMBULATORY_CARE_PROVIDER_SITE_OTHER)
Admission: RE | Admit: 2012-06-03 | Discharge: 2012-06-03 | Disposition: A | Discharge status: Home / Self Care | Payer: Medicare Other | Source: Ambulatory Visit | Attending: Internal Medicine | Admitting: Internal Medicine

## 2012-06-03 VITALS — BP 114/80 | HR 60 | Temp 96.7°F | Ht 62.0 in | Wt 124.4 lb

## 2012-06-03 DIAGNOSIS — R0989 Other specified symptoms and signs involving the circulatory and respiratory systems: Secondary | ICD-10-CM

## 2012-06-03 DIAGNOSIS — R0600 Dyspnea, unspecified: Secondary | ICD-10-CM | POA: Insufficient documentation

## 2012-06-03 DIAGNOSIS — R0609 Other forms of dyspnea: Secondary | ICD-10-CM

## 2012-06-03 NOTE — Patient Instructions (Addendum)
Stop spiriva and then after a week off wean off symbicort   Prilosec 20 mg   Take 30-60 min before first meal of the day and Pepcid 20 mg one bedtime until return to office - this is the best way to tell whether stomach acid is contributing to your problem.    GERD (REFLUX)  is an extremely common cause of respiratory symptoms, many times with no significant heartburn at all.    It can be treated with medication, but also with lifestyle changes including avoidance of late meals, excessive alcohol, smoking cessation, and avoid fatty foods, chocolate, peppermint, colas, red wine, and acidic juices such as orange juice.  NO MINT OR MENTHOL PRODUCTS SO NO COUGH DROPS  USE SUGARLESS CANDY INSTEAD (jolley ranchers or Stover's)  NO OIL BASED VITAMINS - use powdered substitutes.  Please remember to go to the  x-ray department downstairs for your tests - we will call you with the results when they are available.  Please schedule a follow up office visit in 4 weeks, sooner if needed with full pft's on return

## 2012-06-03 NOTE — Progress Notes (Signed)
  Subjective:    Patient ID: [name], female    DOB: September 28, 1923  MRN: [id number]  HPI  33 yowf never smoker, 10 children s resp complicatoins, no allergy or asthma dx with onset breathing problems around 2010 dx as copd but no improvement with with spiriva or symbicort so referred 06/03/2012 to pulmonary clinic by Dr Graciela Husbands for evaluation.  06/03/2012 1st pulmonary eval cc indolent onset doe x 3-4 and progressive to point where sob x 50 ft, uses hc parking leans on cart at HT no 02 assoc with weak legs and hoarseness and throat swallowing x one month  - eval by Dr Graciela Husbands neg for cardiac source for atypical cp.   No obvious daytime variabilty or assoc chronic cough or pleuritic or reproducible exertional cp or chest tightness, subjective wheeze overt sinus or hb symptoms. No unusual exp hx or h/o childhood pna/ asthma or premature birth to her  knowledge.    Sleeping ok without nocturnal  or early am exacerbation  of respiratory  c/o's or need for noct saba. Also denies any obvious fluctuation of symptoms with weather or environmental changes or other aggravating or alleviating factors except as outlined above   Review of Systems  Constitutional: Negative for fever, chills and unexpected weight change.  HENT: Positive for trouble swallowing. Negative for ear pain, nosebleeds, congestion, sore throat, rhinorrhea, sneezing, dental problem, voice change, postnasal drip and sinus pressure.   Eyes: Negative for visual disturbance.  Respiratory: Positive for shortness of breath. Negative for cough and choking.   Cardiovascular: Negative for chest pain and leg swelling.  Gastrointestinal: Negative for vomiting, abdominal pain and diarrhea.  Genitourinary: Negative for difficulty urinating.  Musculoskeletal: Negative for arthralgias.  Skin: Negative for rash.  Neurological: Negative for tremors, syncope and headaches.  Hematological: Does not bruise/bleed easily.       Objective:   Physical  Exam Wt Readings from Last 3 Encounters:  06/03/12 124 lb 6.4 oz (56.427 kg)  04/29/12 123 lb (55.792 kg)  04/15/12 123 lb (55.792 kg)    Pleasant amb wf nad with freq throat clearing  HEENT: nl dentition, turbinates, and orophanx. Nl external ear canals without cough reflex   NECK :  without JVD/Nodes/TM/ nl carotid upstrokes bilaterally   LUNGS: no acc muscle use, clear to A and P bilaterally without cough on insp or exp maneuvers   CV:  RRR  no s3 or murmur or increase in P2, no edema   ABD:  soft and nontender with nl excursion in the supine position. No bruits or organomegaly, bowel sounds nl  MS:  warm without deformities, calf tenderness, cyanosis or clubbing  SKIN: warm and dry without lesions    NEURO:  alert, approp, no deficits     CXR  06/03/2012 :  No acute cardiopulmonary abnormality.           Assessment & Plan:

## 2012-06-03 NOTE — Assessment & Plan Note (Signed)
-   06/03/2012   Walked RA x one lap @ 185 stopped due to  Sob no desat - 06/03/2012 spirometry no obstruction  Symptoms are markedly disproportionate to objective findings and not clear this is a lung problem but pt does appear to have difficult airway management issues. DDX of  difficult airways managment all start with A and  include Adherence, Ace Inhibitors, Acid Reflux, Active Sinus Disease, Alpha 1 Antitripsin deficiency, Anxiety masquerading as Airways dz,  ABPA,  allergy(esp in young), Aspiration (esp in elderly), Adverse effects of DPI,  Active smokers, plus two Bs  = Bronchiectasis and Beta blocker use..and one C= CHF  ? Acid reflux suggested by atypical cp and dysphagia> try gerd rx and diet x 4 weeks then regroup

## 2012-06-04 NOTE — Progress Notes (Signed)
Quick Note:  Spoke with pt and notified of results per Dr. Wert. Pt verbalized understanding and denied any questions.  ______ 

## 2012-06-08 ENCOUNTER — Ambulatory Visit (INDEPENDENT_AMBULATORY_CARE_PROVIDER_SITE_OTHER): Payer: Medicare Other | Admitting: *Deleted

## 2012-06-08 DIAGNOSIS — Z7901 Long term (current) use of anticoagulants: Secondary | ICD-10-CM

## 2012-06-08 DIAGNOSIS — I4891 Unspecified atrial fibrillation: Secondary | ICD-10-CM

## 2012-06-09 ENCOUNTER — Telehealth: Payer: Self-pay | Admitting: Internal Medicine

## 2012-06-09 NOTE — Telephone Encounter (Signed)
Spoke with patient, she said she didn't call but maybe Nettie Elm her caregiver called coumadin clinic, she said she would check with her and pt hung up phone.

## 2012-06-09 NOTE — Telephone Encounter (Signed)
Spoke with Nettie Elm pt's caregiver advised per anticoag note in EPIC from yesterday pt was to take 1.5 tablets today, then resume same dosage 1 tablet daily except 1/2 tablet on Mondays and Fridays.

## 2012-06-09 NOTE — Telephone Encounter (Signed)
New Problem:    Patient called in wanting to know the patient's doses for today.  Please call back.

## 2012-06-22 ENCOUNTER — Institutional Professional Consult (permissible substitution): Payer: Medicare Other | Admitting: Pulmonary Disease

## 2012-06-22 ENCOUNTER — Ambulatory Visit (INDEPENDENT_AMBULATORY_CARE_PROVIDER_SITE_OTHER): Payer: Medicare Other

## 2012-06-22 DIAGNOSIS — I4891 Unspecified atrial fibrillation: Secondary | ICD-10-CM

## 2012-06-22 DIAGNOSIS — Z7901 Long term (current) use of anticoagulants: Secondary | ICD-10-CM

## 2012-06-22 LAB — POCT INR: INR: 1.4

## 2012-06-23 ENCOUNTER — Telehealth: Payer: Self-pay | Admitting: Internal Medicine

## 2012-06-23 NOTE — Telephone Encounter (Signed)
New problem     C/O heart rate this am  @ 11:15 was  158. No chest pain. H/O copd.

## 2012-06-23 NOTE — Telephone Encounter (Signed)
Spoke with pt, she went to exercise this morning. She rode the bike and her heart rate got up to 158. This is the highest it has been. Once she stopped exercise the heart rate did not really go back down. She does not know how long her pulse may have been elevated. She can not tell what her pulse is now. She reports she felt fine. She is going to do the same thing tomorrow and will call back if the pulse is elevated.

## 2012-06-29 ENCOUNTER — Ambulatory Visit (INDEPENDENT_AMBULATORY_CARE_PROVIDER_SITE_OTHER): Payer: Medicare Other

## 2012-06-29 DIAGNOSIS — Z7901 Long term (current) use of anticoagulants: Secondary | ICD-10-CM

## 2012-06-29 DIAGNOSIS — I4891 Unspecified atrial fibrillation: Secondary | ICD-10-CM

## 2012-06-29 LAB — POCT INR: INR: 2.7

## 2012-07-07 ENCOUNTER — Ambulatory Visit (INDEPENDENT_AMBULATORY_CARE_PROVIDER_SITE_OTHER): Payer: Medicare Other | Admitting: Internal Medicine

## 2012-07-07 ENCOUNTER — Encounter: Payer: Self-pay | Admitting: Internal Medicine

## 2012-07-07 VITALS — BP 136/80 | HR 51 | Temp 97.5°F | Ht 60.25 in | Wt 122.0 lb

## 2012-07-07 DIAGNOSIS — R0989 Other specified symptoms and signs involving the circulatory and respiratory systems: Secondary | ICD-10-CM

## 2012-07-07 DIAGNOSIS — R0600 Dyspnea, unspecified: Secondary | ICD-10-CM

## 2012-07-07 NOTE — Patient Instructions (Addendum)
Nexium 40 mg Take 30-60 min before first meal of the day and pepcid 20 mg one daily automatically for a month  GERD (REFLUX)  is an extremely common cause of respiratory symptoms just like yours, many times with no significant heartburn at all.    It can be treated with medication, but also with lifestyle changes including avoidance of late meals, excessive alcohol, smoking cessation, and avoid fatty foods, chocolate, peppermint, colas, red wine, and acidic juices such as orange juice.  NO MINT OR MENTHOL PRODUCTS SO NO COUGH DROPS  USE SUGARLESS CANDY INSTEAD (jolley ranchers or Stover's)  NO OIL BASED VITAMINS - use powdered substitutes.    Please schedule a follow up office visit in 4 weeks, sooner if needed for PFT's

## 2012-07-07 NOTE — Progress Notes (Signed)
Subjective:    Patient ID: [name], female    DOB: 06-25-1923  MRN: [id number]  HPI  52 yowf never smoker, 10 children s resp complicatoins, no allergy or asthma dx with onset breathing problems around 2010 dx as copd but no improvement with with spiriva or symbicort so referred 06/03/2012 to pulmonary clinic by Dr Graciela Husbands for evaluation. Primary is [facility] in [place]. No clear reason for sob   06/03/2012 1st pulmonary eval cc indolent onset doe x 3-4 and progressive to point where sob x 50 ft, uses [facility] parking leans on cart at [facility] no 02 assoc with weak legs and hoarseness and throat swallowing x one month  - eval by Dr Graciela Husbands neg for cardiac source for atypical cp.  Stop spiriva and then after a week off wean off symbicort  Prilosec 20 mg   Take 30-60 min before first meal of the day and Pepcid 20 mg one bedtime until return to office - this is the best way to tell whether stomach acid is contributing to your problem.   GERD rx Please schedule a follow up office visit in 4 weeks, sooner if needed with full pft's on return    07/07/2012 f/u ov/Nicole Garrett re doe Chief Complaint  Patient presents with  . Acute Visit    increased SOB with any activity   doe x 50 ft reprocible, daily, indolent onset progressive x 2010 told she had copd in past assoc with hoarseness not taking gerd rx as rec previously  No obvious daytime variabilty or assoc chronic cough or pleuritic or reproducible exertional cp or chest tightness, subjective wheeze overt sinus or hb symptoms. No unusual exp hx or h/o childhood pna/ asthma or premature birth to her  knowledge.    Sleeping ok without nocturnal  or early am exacerbation  of respiratory  c/o's or need for noct saba. Also denies any obvious fluctuation of symptoms with weather or environmental changes or other aggravating or alleviating factors except as outlined above   .  Current Medications, Allergies, Past Medical History, Past Surgical History, Family History, and  Social History were reviewed in [employer] record.  ROS  The following are not active complaints unless bolded sore throat, dysphagia, dental problems, itching, sneezing,  nasal congestion or excess/ purulent secretions, ear ache,   fever, chills, sweats, unintended wt loss, pleuritic or exertional cp, hemoptysis,  orthopnea pnd or leg swelling, presyncope, palpitations, heartburn, abdominal pain, anorexia, nausea, vomiting, diarrhea  or change in bowel or urinary habits, change in stools or urine, dysuria,hematuria,  rash, arthralgias, visual complaints, headache, numbness weakness or ataxia or problems with walking or coordination,  change in mood/affect or memory. Tremor worse x one year          Objective:   Physical Exam Wt Readings from Last 3 Encounters:  06/03/12 124 lb 6.4 oz (56.427 kg)  04/29/12 123 lb (55.792 kg)  04/15/12 123 lb (55.792 kg)    Pleasant amb wf nad with freq throat clearing  HEENT: nl dentition, turbinates, and orophanx. Nl external ear canals without cough reflex   NECK :  without JVD/Nodes/TM/ nl carotid upstrokes bilaterally   LUNGS: no acc muscle use, clear to A and P bilaterally without cough on insp or exp maneuvers   CV:  RRR  no s3 or murmur or increase in P2, no edema   ABD:  soft and nontender with nl excursion in the supine position. No bruits or organomegaly, bowel sounds nl  MS:  warm without deformities, calf tenderness, cyanosis or clubbing  SKIN: warm and dry without lesions    NEURO:  alert, approp, no deficits     CXR  06/03/2012 :  No acute cardiopulmonary abnormality.           Assessment & Plan:

## 2012-07-08 ENCOUNTER — Encounter: Payer: Self-pay | Admitting: Internal Medicine

## 2012-07-08 NOTE — Assessment & Plan Note (Addendum)
-   06/03/2012   Walked RA x one lap @ 185 stopped due to  Sob no desat - 06/03/2012 spirometry no obstruction -07/07/2012  Walked RA  1 laps @ 185 ft each stopped due to fatigue > sob  I had an extended discussion with the patient today lasting 15 to 20 minutes of a 25 minute visit on the following issues:  Did not follow instructions re max gerd rx and because of nl spirometry and hoarseness this needs to happen before the next study or empirical trial > full pft's then consider cpst though don't think she could walk far enough to get any useful info as she seems very deconditioned

## 2012-07-13 ENCOUNTER — Encounter: Payer: Self-pay | Admitting: Internal Medicine

## 2012-07-13 ENCOUNTER — Ambulatory Visit (INDEPENDENT_AMBULATORY_CARE_PROVIDER_SITE_OTHER): Payer: Medicare Other | Admitting: *Deleted

## 2012-07-13 DIAGNOSIS — I4891 Unspecified atrial fibrillation: Secondary | ICD-10-CM

## 2012-07-13 DIAGNOSIS — Z950 Presence of cardiac pacemaker: Secondary | ICD-10-CM

## 2012-07-14 LAB — REMOTE PACEMAKER DEVICE
BRDY-0002RV: 70 {beats}/min
RV LEAD IMPEDENCE PM: 460 Ohm

## 2012-07-15 ENCOUNTER — Ambulatory Visit: Payer: Medicare Other | Admitting: Internal Medicine

## 2012-07-15 DIAGNOSIS — Z0289 Encounter for other administrative examinations: Secondary | ICD-10-CM

## 2012-07-20 ENCOUNTER — Ambulatory Visit: Payer: Medicare Other | Admitting: Internal Medicine

## 2012-07-21 ENCOUNTER — Telehealth: Payer: Self-pay | Admitting: Internal Medicine

## 2012-07-21 NOTE — Telephone Encounter (Signed)
Returned call to patient she stated she has been having chest pain off and on for 2 weeks and rapid heart beat off and on for 1 week.States at present she is feeling ok.States she would like to see Dr.Klein this week.Appointment scheduled with Dr.Klein tomorrow 07/22/12 at 10:00 am.

## 2012-07-21 NOTE — Telephone Encounter (Signed)
New problem  Patients son called and said his mom called him and said she was having problems with her heart. He did not know what was going. She asked him to call here and ask you to call her when you get a chance.

## 2012-07-22 ENCOUNTER — Other Ambulatory Visit: Payer: Self-pay

## 2012-07-22 ENCOUNTER — Ambulatory Visit (INDEPENDENT_AMBULATORY_CARE_PROVIDER_SITE_OTHER): Payer: Medicare Other | Admitting: Pharmacist

## 2012-07-22 ENCOUNTER — Telehealth: Payer: Self-pay | Admitting: Internal Medicine

## 2012-07-22 ENCOUNTER — Ambulatory Visit (INDEPENDENT_AMBULATORY_CARE_PROVIDER_SITE_OTHER): Payer: Medicare Other | Admitting: Internal Medicine

## 2012-07-22 ENCOUNTER — Encounter: Payer: Self-pay | Admitting: Internal Medicine

## 2012-07-22 VITALS — BP 158/121 | HR 76 | Wt 123.0 lb

## 2012-07-22 DIAGNOSIS — I4891 Unspecified atrial fibrillation: Secondary | ICD-10-CM

## 2012-07-22 DIAGNOSIS — Z7901 Long term (current) use of anticoagulants: Secondary | ICD-10-CM

## 2012-07-22 DIAGNOSIS — I5032 Chronic diastolic (congestive) heart failure: Secondary | ICD-10-CM

## 2012-07-22 DIAGNOSIS — R079 Chest pain, unspecified: Secondary | ICD-10-CM

## 2012-07-22 LAB — POCT INR: INR: 1.7

## 2012-07-22 NOTE — Telephone Encounter (Signed)
Provided patient/family with number for [facility] assistance to get a new activation code.

## 2012-07-22 NOTE — Progress Notes (Signed)
Patient Care Team: Loyal Jacobson as PCP - General (Family Medicine)   HPI  [name] is a 79 y.o. female With chronic shortness of breath who underwent AV junction ablation for atrial fibrillation with a rapid rate that was uncontrollable.   She continues to have complaints of significant shortness of breath  ; she has been seeing Dr. Sherene Sires for this will continues to evaluate. Together from his note that he is concerned about aspiration   She is seen today as an add on because of chest pain.  " I am having a nervous breakdown."  Chest pain is sharp and fleeting.  Unassociated with exertion  Myoview 2014 demonstrated low risk scan with no scar or ischemia Interval echo demonstrated normal left ventricular function and only mild postsurgical MR    Past Medical History  Diagnosis Date  . ABNORMAL ELECTROCARDIOGRAM   . ACID REFLUX DISEASE   . ANEMIA-NOS   . Atrial fibrillation   . BREAST CANCER   . CHF   . COPD   . DEPRESSION/ANXIETY   . DIVERTICULITIS, HX OF   . DYSGEUSIA   . Essential hypertension, benign   . FATIGUE   . GLAUCOMA   . Shortness of breath   . Pacemaker   . LYMPHEDEMA, RIGHT ARM     Past Surgical History  Procedure Laterality Date  . Mitral valve repair  2002  . Esophagogastrodenoscopy    . Partial colectomy    . Shoulder surgery    . Mastectomy      right breast   . Cardioversion  01/21/2011    Procedure: CARDIOVERSION;  Surgeon: Duke Salvia, MD;  Location: [facility] OR;  Service: Cardiovascular;  Laterality: N/A;  OUT PATIENT CARDIOVERSION  . Cardioversion  05/16/2011    Procedure: CARDIOVERSION;  Surgeon: Duke Salvia, MD;  Location: [facility] OR;  Service: Cardiovascular;  Laterality: N/A;  . Pacemaker insertion  2012  . Cardioversion  01/02/2012    Procedure: CARDIOVERSION;  Surgeon: Gaylord Shih, MD;  Location: [facility] ENDOSCOPY;  Service: Cardiovascular;  Laterality: N/A;  Dr. Graciela Husbands will be coming over to do Cardioversion    Current Outpatient  Prescriptions  Medication Sig Dispense Refill  . clonazePAM (KLONOPIN) 1 MG tablet Take 1 mg by mouth daily as needed. For anxiety      . desvenlafaxine (PRISTIQ) 50 MG 24 hr tablet Take 25 mg by mouth daily.       Tery Sanfilippo Sodium (DULCOLAX STOOL SOFTENER PO) Take 1 capsule by mouth daily.       . timolol (BETIMOL) 0.5 % ophthalmic solution Place 1 drop into both eyes daily.       . vitamin B-12 (CYANOCOBALAMIN) 1000 MCG tablet Take 1,000 mcg by mouth daily.       Marland Kitchen warfarin (COUMADIN) 5 MG tablet Take as directed by anticoagulation clinic  30 tablet  3  . zolpidem (AMBIEN) 10 MG tablet Take 10 mg by mouth at bedtime.       . [DISCONTINUED] sertraline (ZOLOFT) 25 MG tablet 1/2 tablet for 7 days, then one tab daily  30 tablet  1   No current facility-administered medications for this visit.    Allergies  Allergen Reactions  . Citalopram Hydrobromide Other (See Comments)    Causes depression  . Codeine     REACTION: Nausea and cold sweats  . Cymbalta (Duloxetine Hcl) Other (See Comments)    Nausea nervousness  . Diltiazem Other (See Comments)    Doesn't remember  .  Multaq (Dronedarone Hydrochloride) Other (See Comments)    unknown  . Pristiq (Desvenlafaxine Succinate Monohydrate) Other (See Comments)    palpitations  . Propafenone Hcl     REACTION: Reaction not known  . Sertraline Other (See Comments)    nervouseness  . Tramadol Other (See Comments)    unknown    Review of Systems negative except from HPI and PMH  Physical Exam BP 158/121  Pulse 76  Wt 123 lb (55.792 kg)  BMI 23.83 kg/m2 Well developed and nourished in no acute distress HENT normal Neck supple with JVP-flat Clear Regular rate and rhythm, no murmurs or gallops Abd-soft with active BS No Clubbing cyanosis edema Skin-warm and dry A & Oriented  Grossly normal sensory and motor function Affect sad  Ecg atrial fibrillation with underlying ventricular pacing and frequent PVCs with an inferior axis  morphology and a very narrow QRS  Assessment and  Plan

## 2012-07-22 NOTE — Telephone Encounter (Signed)
New Problem:    Called in wanting an activation code to sign her mother up for mychart and to have access to her records.  Please call back.

## 2012-07-22 NOTE — Assessment & Plan Note (Signed)
Chest pain is atypical in the context of a negative Myoview is almost really not cardiac. It occurs in the context of a decompensated psychological state to which she alludes and about which we had a 20-30 minute discussion regarding the importance of following up with her psychiatrist after the medication adjustment made yesterday and trying to make small prolactin decisions related to her isolation and her inability to drive.

## 2012-07-22 NOTE — Patient Instructions (Addendum)
**Note De-Identified  Obfuscation** Your physician recommends that you schedule a follow-up appointment in: in September

## 2012-07-22 NOTE — Assessment & Plan Note (Signed)
Euvolemic. 

## 2012-08-03 ENCOUNTER — Other Ambulatory Visit: Payer: Self-pay | Admitting: *Deleted

## 2012-08-04 MED ORDER — WARFARIN SODIUM 5 MG PO TABS: ORAL_TABLET | ORAL | Status: DC

## 2012-08-05 ENCOUNTER — Ambulatory Visit (INDEPENDENT_AMBULATORY_CARE_PROVIDER_SITE_OTHER): Payer: Medicare Other | Admitting: *Deleted

## 2012-08-05 DIAGNOSIS — I4891 Unspecified atrial fibrillation: Secondary | ICD-10-CM

## 2012-08-05 DIAGNOSIS — Z7901 Long term (current) use of anticoagulants: Secondary | ICD-10-CM

## 2012-08-06 ENCOUNTER — Encounter: Payer: Self-pay | Admitting: *Deleted

## 2012-08-18 ENCOUNTER — Ambulatory Visit (INDEPENDENT_AMBULATORY_CARE_PROVIDER_SITE_OTHER): Payer: Medicare Other | Admitting: *Deleted

## 2012-08-18 DIAGNOSIS — I4891 Unspecified atrial fibrillation: Secondary | ICD-10-CM

## 2012-08-18 DIAGNOSIS — Z7901 Long term (current) use of anticoagulants: Secondary | ICD-10-CM

## 2012-08-21 ENCOUNTER — Ambulatory Visit: Payer: Medicare Other | Admitting: Internal Medicine

## 2012-09-10 ENCOUNTER — Ambulatory Visit (INDEPENDENT_AMBULATORY_CARE_PROVIDER_SITE_OTHER): Payer: Medicare Other | Admitting: Internal Medicine

## 2012-09-10 ENCOUNTER — Encounter: Payer: Self-pay | Admitting: Internal Medicine

## 2012-09-10 VITALS — BP 130/72 | HR 75 | Temp 97.1°F | Ht 59.5 in | Wt 124.0 lb

## 2012-09-10 DIAGNOSIS — R0989 Other specified symptoms and signs involving the circulatory and respiratory systems: Secondary | ICD-10-CM

## 2012-09-10 DIAGNOSIS — R0600 Dyspnea, unspecified: Secondary | ICD-10-CM

## 2012-09-10 LAB — PULMONARY FUNCTION TEST

## 2012-09-10 MED ORDER — MOMETASONE FURO-FORMOTEROL FUM 100-5 MCG/ACT IN AERO: INHALATION_SPRAY | RESPIRATORY_TRACT | Status: DC

## 2012-09-10 MED ORDER — PREDNISONE (PAK) 10 MG PO TABS: ORAL_TABLET | ORAL | Status: DC

## 2012-09-10 NOTE — Progress Notes (Signed)
Subjective:    Patient ID: [name], female    DOB: August 12, 1923  MRN: [id number]    Brief patient profile:  60 yowf never smoker, 10 children s resp complications, no allergy or asthma dx with onset breathing problems around 2010 dx as copd but no improvement with with spiriva or symbicort so referred 06/03/2012 to pulmonary clinic by Dr Graciela Husbands for evaluation. Primary is [facility] in [place]. No clear reason for sob   06/03/2012 1st pulmonary eval cc indolent onset doe x 3-4y and progressive to point where sob x 50 ft, uses [facility] parking leans on cart at [facility] no 02 assoc with weak legs and hoarseness and throat swallowing x one month  - eval by Dr Graciela Husbands neg for cardiac source for atypical cp.  Stop spiriva and then after a week off wean off symbicort  Prilosec 20 mg   Take 30-60 min before first meal of the day and Pepcid 20 mg one bedtime until return to office - this is the best way to tell whether stomach acid is contributing to your problem.   GERD rx Please schedule a follow up office visit in 4 weeks, sooner if needed with full pft's on return    07/07/2012 f/u ov/[name] re doe Chief Complaint  Patient presents with  . Acute Visit    increased SOB with any activity   doe x 50 ft reprocible, daily, indolent onset progressive x 2010 told she had copd in past assoc with hoarseness not taking gerd rx as rec previously rec Nexium 40 mg Take 30-60 min before first meal of the day and pepcid 20 mg one daily automatically for a month GERD  09/10/2012 f/u ov/[name] unexplained sob Chief Complaint  Patient presents with  . Followup with PFT    Pt reports her breathing is the same, no better or worse since the last visit. No new co's today..   off gerd rx x 3 weeks no better on it, no worse off it, still sob x 50 ft    No obvious daytime variabilty or assoc chronic cough or pleuritic or reproducible exertional cp or chest tightness, subjective wheeze overt sinus or hb symptoms. No unusual exp hx or h/o  childhood pna/ asthma or premature birth to her  knowledge.    Sleeping ok without nocturnal  or early am exacerbation  of respiratory  c/o's or need for noct saba. Also denies any obvious fluctuation of symptoms with weather or environmental changes or other aggravating or alleviating factors except as outlined above   .  Current Medications, Allergies, Past Medical History, Past Surgical History, Family History, and Social History were reviewed in [employer] record.  ROS  The following are not active complaints unless bolded sore throat, dysphagia, dental problems, itching, sneezing,  nasal congestion or excess/ purulent secretions, ear ache,   fever, chills, sweats, unintended wt loss, pleuritic or exertional cp, hemoptysis,  orthopnea pnd or leg swelling, presyncope, palpitations, heartburn, abdominal pain, anorexia, nausea, vomiting, diarrhea  or change in bowel or urinary habits, change in stools or urine, dysuria,hematuria,  rash, arthralgias, visual complaints, headache, numbness weakness or ataxia or problems with walking or coordination,  change in mood/affect or memory. Tremor worse x one year          Objective:   Physical Exam  09/10/2012     124    06/03/12 124 lb 6.4 oz (56.427 kg)  04/29/12 123 lb (55.792 kg)  04/15/12 123 lb (55.792 kg)  Pleasant amb wf nad with freq throat clearing  HEENT: nl dentition, turbinates, and orophanx. Nl external ear canals without cough reflex   NECK :  without JVD/Nodes/TM/ nl carotid upstrokes bilaterally   LUNGS: no acc muscle use, clear to A and P bilaterally without cough on insp or exp maneuvers   CV:  RRR  no s3 or murmur or increase in P2, no edema   ABD:  soft and nontender with nl excursion in the supine position. No bruits or organomegaly, bowel sounds nl  MS:  warm without deformities, calf tenderness, cyanosis or clubbing  SKIN: warm and dry without lesions    NEURO:  alert, approp, no  deficits     CXR  06/03/2012 :  No acute cardiopulmonary abnormality.           Assessment & Plan:

## 2012-09-10 NOTE — Patient Instructions (Addendum)
Work on inhaler technique:  relax and gently blow all the way out then take a nice smooth deep breath back in, triggering the inhaler at same time you start breathing in.  Hold for up to 5 seconds if you can.  Rinse and gargle with water when done   If your mouth or throat starts to bother you,   I suggest you time the inhaler to your dental care and after using the inhaler(s) brush teeth and tongue with a baking soda containing toothpaste and when you rinse this out, gargle with it first to see if this helps your mouth and throat.    dulera 100 one twice daily   Prednisone 10 mg take  4 each am x 2 days,   2 each am x 2 days,  1 each am x 2 days and stop   Please schedule a follow up office visit in 4 weeks, sooner if needed

## 2012-09-10 NOTE — Progress Notes (Signed)
PFT done today. 

## 2012-09-10 NOTE — Assessment & Plan Note (Signed)
-   06/03/2012   Walked RA x one lap @ 185 stopped due to  Sob no desat - 06/03/2012 spirometry no obstruction -07/07/2012  Walked RA  1 laps @ 185 ft each stopped due to fatigue > sob - 09/10/2012 PFT's FEV1  1.14 (88%) ratio 68 with 14% better p B2,  DLCO 71% corrects to 97% - 09/10/2012   Walked RA x one lap @ 185 stopped due to  Leg pain> sob  and sats 94% at stop    pft's do suggest a borderline airflow obst pattern so rec trial of dulera 100 one bid   The proper method of use, as well as anticipated side effects, of a metered-dose inhaler are discussed and demonstrated to the patient. Improved effectiveness after extensive coaching during this visit to a level of approximately  50%

## 2012-09-22 ENCOUNTER — Encounter: Payer: Self-pay | Admitting: Internal Medicine

## 2012-09-22 ENCOUNTER — Ambulatory Visit (INDEPENDENT_AMBULATORY_CARE_PROVIDER_SITE_OTHER): Payer: Medicare Other | Admitting: *Deleted

## 2012-09-22 DIAGNOSIS — I4891 Unspecified atrial fibrillation: Secondary | ICD-10-CM

## 2012-09-22 DIAGNOSIS — Z7901 Long term (current) use of anticoagulants: Secondary | ICD-10-CM

## 2012-09-22 LAB — POCT INR: INR: 2.8

## 2012-10-08 ENCOUNTER — Ambulatory Visit: Payer: Medicare Other | Admitting: Internal Medicine

## 2012-10-09 ENCOUNTER — Ambulatory Visit: Payer: Medicare Other | Admitting: Internal Medicine

## 2012-10-14 ENCOUNTER — Ambulatory Visit (INDEPENDENT_AMBULATORY_CARE_PROVIDER_SITE_OTHER): Payer: Medicare Other | Admitting: *Deleted

## 2012-10-14 DIAGNOSIS — I4891 Unspecified atrial fibrillation: Secondary | ICD-10-CM

## 2012-10-14 DIAGNOSIS — Z7901 Long term (current) use of anticoagulants: Secondary | ICD-10-CM

## 2012-10-16 ENCOUNTER — Ambulatory Visit (INDEPENDENT_AMBULATORY_CARE_PROVIDER_SITE_OTHER): Payer: Medicare Other | Admitting: Internal Medicine

## 2012-10-16 ENCOUNTER — Encounter: Payer: Self-pay | Admitting: Internal Medicine

## 2012-10-16 VITALS — BP 118/72 | HR 81 | Temp 96.7°F | Ht 60.0 in | Wt 123.0 lb

## 2012-10-16 DIAGNOSIS — R0609 Other forms of dyspnea: Secondary | ICD-10-CM

## 2012-10-16 DIAGNOSIS — R0600 Dyspnea, unspecified: Secondary | ICD-10-CM

## 2012-10-16 NOTE — Patient Instructions (Addendum)
Stop the dulera  Follow up is as needed

## 2012-10-16 NOTE — Progress Notes (Signed)
Subjective:    Patient ID: [name], female    DOB: [birth year]  MRN: [id number]    Brief patient profile:  74 yowf never smoker, 10 children s resp complications, no allergy or asthma dx with onset breathing problems around 2010 dx as copd but no improvement with with spiriva or symbicort so referred 06/03/2012 to pulmonary clinic by Dr Graciela Husbands for evaluation. Primary is [facility] in [place]. No clear reason for Dyspnea with minimal but fully  reversible airflow obst on pfts   HPI 06/03/2012 1st pulmonary eval cc indolent onset doe x 3-4y and progressive to point where sob x 50 ft, uses [facility] parking leans on cart at [facility] no 02 assoc with weak legs and hoarseness and throat swallowing x one month  - eval by Dr Graciela Husbands neg for cardiac source for atypical cp.  Stop spiriva and then after a week off wean off symbicort  Prilosec 20 mg   Take 30-60 min before first meal of the day and Pepcid 20 mg one bedtime until return to office - this is the best way to tell whether stomach acid is contributing to your problem.   GERD rx Please schedule a follow up office visit in 4 weeks, sooner if needed with full pft's on return    07/07/2012 f/u ov/Leondra Cullin re doe Chief Complaint  Patient presents with  . Acute Visit    increased SOB with any activity   doe x 50 ft reprocible, daily, indolent onset progressive x 2010 told she had copd in past assoc with hoarseness not taking gerd rx as rec previously rec Nexium 40 mg Take 30-60 min before first meal of the day and pepcid 20 mg one daily automatically for a month GERD  09/10/2012 f/u ov/Desia Saban unexplained sob Chief Complaint  Patient presents with  . Followup with PFT    Pt reports her breathing is the same, no better or worse since the last visit. No new co's today..   off gerd rx x 3 weeks no better on it, no worse off it, still sob x 50 ft  rec Work on inhaler technique:  .   dulera 100 one twice daily  Prednisone 10 mg take  4 each am x 2 days,   2 each am x 2  days,  1 each am x 2 days and stop   10/16/2012 f/u ov/Jared Cahn re Dyspnea with minimal reversible airflow obst on pfts  Chief Complaint  Patient presents with  . Follow-up    Pt states breathing is unchanged since the last visit. No new co's today.    No obvious daytime variabilty or assoc chronic cough or pleuritic or reproducible exertional cp or chest tightness, subjective wheeze overt sinus or hb symptoms. No unusual exp hx or h/o childhood pna/ asthma or premature birth to her  knowledge.    Sleeping ok without nocturnal  or early am exacerbation  of respiratory  c/o's or need for noct saba. Also denies any obvious fluctuation of symptoms with weather or environmental changes or other aggravating or alleviating factors except as outlined above   .  Current Medications, Allergies, Past Medical History, Past Surgical History, Family History, and Social History were reviewed in [employer] record.  ROS  The following are not active complaints unless bolded sore throat, dysphagia, dental problems, itching, sneezing,  nasal congestion or excess/ purulent secretions, ear ache,   fever, chills, sweats, unintended wt loss, pleuritic or exertional cp, hemoptysis,  orthopnea pnd or  leg swelling, presyncope, palpitations, heartburn, abdominal pain, anorexia, nausea, vomiting, diarrhea  or change in bowel or urinary habits, change in stools or urine, dysuria,hematuria,  rash, arthralgias, visual complaints, headache, numbness weakness or ataxia or problems with walking or coordination,  change in mood/affect or memory. Tremor worse x one year          Objective:   Physical Exam  09/10/2012     124  Vs  123 10/16/2012    06/03/12 124 lb 6.4 oz (56.427 kg)  04/29/12 123 lb (55.792 kg)  04/15/12 123 lb (55.792 kg)    Pleasant amb wf nad with less freq throat clearing  HEENT: nl dentition, turbinates, and orophanx. Nl external ear canals without cough reflex   NECK :  without  JVD/Nodes/TM/ nl carotid upstrokes bilaterally   LUNGS: no acc muscle use, clear to A and P bilaterally without cough on insp or exp maneuvers   CV:  RRR  no s3 or murmur or increase in P2, no edema   ABD:  soft and nontender with nl excursion in the supine position. No bruits or organomegaly, bowel sounds nl  MS:  warm without deformities, calf tenderness, cyanosis or clubbing  SKIN: warm and dry without lesions            CXR  06/03/2012 :  No acute cardiopulmonary abnormality.           Assessment & Plan:

## 2012-10-18 NOTE — Assessment & Plan Note (Signed)
-   06/03/2012   Walked RA x one lap @ 185 stopped due to  Sob no desat - 06/03/2012 spirometry no obstruction -07/07/2012  Walked RA  1 laps @ 185 ft each stopped due to fatigue > sob - 09/10/2012 PFT's FEV1  1.14 (88%) ratio 68 with 14% better p B2,  DLCO 71% corrects to 97% - 09/10/2012   Walked RA x one lap @ 185 stopped due to  Leg pain> sob  and sats 94% at stop - hfa 50% p coaching 09/10/2012 > trial of dulera 100 one bid > no better 10/16/2012 so d/c -10/16/2012  Walked RA x 2 laps @ 185 ft each stopped due to  Fatigue no desat  Symptoms of doe not reproducible or physiologically explained > strongly suspect depression/ deconditioning.  rec d/c dulera as Pt not convinced the meds are making any difference in  symptoms so it's fine with me to try reducing them and observing for worse symptoms This is the reverse of a therapeutic trial and it a way more difficult for pts to follow than adding meds to list.  "stopping meds to see if it hurts"' rather than "starting meds to see if helps"   Pulmonary f/u can be prn

## 2012-10-24 ENCOUNTER — Encounter: Payer: Self-pay | Admitting: Neurology

## 2012-10-24 DIAGNOSIS — R413 Other amnesia: Secondary | ICD-10-CM

## 2012-10-26 ENCOUNTER — Ambulatory Visit: Payer: Medicare Other | Admitting: Neurology

## 2012-11-03 ENCOUNTER — Other Ambulatory Visit: Payer: Self-pay | Admitting: Cardiovascular Disease

## 2012-11-10 ENCOUNTER — Encounter: Payer: Self-pay | Admitting: Neurology

## 2012-11-10 ENCOUNTER — Ambulatory Visit (INDEPENDENT_AMBULATORY_CARE_PROVIDER_SITE_OTHER): Payer: Medicare Other | Admitting: Neurology

## 2012-11-10 VITALS — BP 138/85 | HR 72 | Ht 61.0 in | Wt 115.0 lb

## 2012-11-10 DIAGNOSIS — R269 Unspecified abnormalities of gait and mobility: Secondary | ICD-10-CM | POA: Insufficient documentation

## 2012-11-10 NOTE — Patient Instructions (Signed)
Split clonazepam 1mg , 1/2 tab bid.

## 2012-11-10 NOTE — Progress Notes (Signed)
[facility]  PATIENT: [name] DOB: 09/27/1923  HISTORICAL  [name] is a 79 years old right-handed Caucasian female, referred by her cardiologist is Dr. Graciela Husbands for evaluation of bilateral hands tremor, she is accompanied by her daughter at today's clinical visit   She has past medical history of pacemaker placement, atrial fibrillation, on chronic Coumadin treatment, history of right breast cancer, status post lobectomy, in 1990s, gait disorder, hypertension, she denies family history of tremor,  She still lives alone in her apartment, ambulate with a cane, over past 1 year, she noticed bilateral hands tremor, especially when holding a cup of coffee, using her utensils, or writing, she also complains of bilateral lower extremity shaky when bearing weight,   She denied lost sense of smell, denies significant memory trouble, she sleeps well, had vivid dreams,  She is taking clonazepam 1 mg every morning, daughter noticed that she was drowsy, legs were heavy after taking medications     REVIEW OF SYSTEMS: Full 14 system review of systems performed and notable only for fatigue, double vision, constipation, itching, runny nose, tremor, depression, anxiety, racing thoughts,  ALLERGIES: Allergies  Allergen Reactions  . Amiodarone   . Citalopram Hydrobromide Other (See Comments)    Causes depression  . Codeine     REACTION: Nausea and cold sweats  . Cymbalta [Duloxetine Hcl] Other (See Comments)    Nausea nervousness  . Diltiazem Other (See Comments)    Doesn't remember  . Multaq [Dronedarone Hydrochloride] Other (See Comments)    unknown  . Pristiq [Desvenlafaxine Succinate Monohydrate] Other (See Comments)    palpitations  . Propafenone Hcl     REACTION: Reaction not known  . Tramadol Other (See Comments)    unknown    HOME MEDICATIONS: Outpatient Prescriptions Prior to Visit  Medication Sig Dispense Refill  . amiodarone (PACERONE) 200 MG tablet  Take 200 mg by mouth daily.      . B Complex Vitamins (VITAMIN B COMPLEX PO) Take by mouth daily.      . brimonidine (ALPHAGAN) 0.15 % ophthalmic solution 1 drop 3 (three) times daily.      . clonazePAM (KLONOPIN) 1 MG tablet Take 1 mg by mouth daily as needed. For anxiety      . Docusate Sodium (DULCOLAX STOOL SOFTENER PO) Take 1 capsule by mouth daily.       . mometasone (NASONEX) 50 MCG/ACT nasal spray Place 2 sprays into the nose daily.      . mometasone-formoterol (DULERA) 100-5 MCG/ACT AERO One twice daily    0  . timolol (BETIMOL) 0.5 % ophthalmic solution Place 1 drop into both eyes daily.       . vitamin B-12 (CYANOCOBALAMIN) 1000 MCG tablet Take 1,000 mcg by mouth daily.       Marland Kitchen warfarin (COUMADIN) 5 MG tablet Take as directed by coumadin clinic  30 tablet  3  . zolpidem (AMBIEN) 10 MG tablet Take 10 mg by mouth at bedtime.       Marland Kitchen buPROPion (WELLBUTRIN) 100 MG tablet Take 100 mg by mouth 2 (two) times daily.      Marland Kitchen escitalopram (LEXAPRO) 10 MG tablet Take 10 mg by mouth. 1/2 qhs prn for insomnia      . sertraline (ZOLOFT) 25 MG tablet Take 12.5 mg by mouth daily.        No facility-administered medications prior to visit.    PAST MEDICAL HISTORY: Past Medical History  Diagnosis Date  . Complete AV  block s/p AV ablation   . ACID REFLUX DISEASE   . ANEMIA-NOS   . Atrial fibrillation   . BREAST CANCER, in 1990s, right   . Chronic diastolic heart failure   . COPD   . DEPRESSION/ANXIETY   . DIVERTICULITIS, HX OF   . DYSGEUSIA   . Essential hypertension, benign   . FATIGUE   . GLAUCOMA   . Shortness of breath   . Pacemaker Medtronic   . LYMPHEDEMA, RIGHT ARM   . Tremor   . Gait disturbance   . Memory loss     PAST SURGICAL HISTORY: Past Surgical History  Procedure Laterality Date  . Mitral valve repair  2002  . Esophagogastrodenoscopy    . Partial colectomy    . Shoulder surgery    . Mastectomy      right breast   . Cardioversion  01/21/2011    Procedure:  CARDIOVERSION;  Surgeon: Duke Salvia, MD;  Location: [facility] OR;  Service: Cardiovascular;  Laterality: N/A;  OUT PATIENT CARDIOVERSION  . Cardioversion  05/16/2011    Procedure: CARDIOVERSION;  Surgeon: Duke Salvia, MD;  Location: [facility] OR;  Service: Cardiovascular;  Laterality: N/A;  . Pacemaker insertion  2012  . Cardioversion  01/02/2012    Procedure: CARDIOVERSION;  Surgeon: Gaylord Shih, MD;  Location: [facility] ENDOSCOPY;  Service: Cardiovascular;  Laterality: N/A;  Dr. Graciela Husbands will be coming over to do Cardioversion    FAMILY HISTORY: Family History  Problem Relation Age of Onset  . Other Mother 66    Died from old age  . Other Father 60    car accident  . Uterine cancer Sister 63  . Asthma Son 34    SOCIAL HISTORY:  History   Social History  . Marital Status: Widowed    Spouse Name: N/A    Number of Children: 10  . Years of Education: college   Occupational History    Retired   Social History Main Topics  . Smoking status: Never Smoker   . Smokeless tobacco: Never Used  . Alcohol Use: 0.6 oz/week    1 Glasses of wine per week     Comment: glass of wine everyday  . Drug Use: No  . Sexual Activity: Not on file    Social History Narrative   Patient is widowed and she is retired.  Patient lives at home alone. Patient has two years of college education.   Right handed.   Caffeine- one cup of coffee daily.     PHYSICAL EXAM   Filed Vitals:   11/10/12 1418  BP: 138/85  Pulse: 72  Height: 5\' 1"  (1.549 m)  Weight: 115 lb (52.164 kg)    Not recorded    Body mass index is 21.74 kg/(m^2).   Generalized: In no acute distress  Neck: Supple, no carotid bruits   Cardiac: Regular rate rhythm  Pulmonary: Clear to auscultation bilaterally  Musculoskeletal: No deformity  Neurological examination  Mentation: Alert oriented to time, place, history taking, and causual conversation  Cranial nerve II-XII: Pupils were equal round reactive to light extraocular  movements were full, visual field were full on confrontational test. facial sensation and strength were normal. hearing was intact to finger rubbing bilaterally. Uvula tongue midline.  head turning and shoulder shrug and were normal and symmetric.Tongue protrusion into cheek strength was normal.  Motor: normal tone, bulk and strength she has mild bilateral hand posture tremor, there was no rigidity,,   Sensory: Intact to fine touch,  pinprick, preserved vibratory sensation, and proprioception at toes.  Coordination: Normal finger to nose, heel-to-shin bilaterally there was no truncal ataxia  Gait: Need to push up from seated position, cautious, unsteady gait,  Romberg signs: Negative  Deep tendon reflexes: Brachioradialis 2/2, biceps 2/2, triceps 2/2, patellar 2/2, Achilles trace, plantar responses were flexor bilaterally.   DIAGNOSTIC DATA (LABS, IMAGING, TESTING) - I reviewed patient records, labs, notes, testing and imaging myself where available.  Lab Results  Component Value Date   WBC 6.5 02/25/2012   HGB 15.9* 02/25/2012   HCT 46.0 02/25/2012   MCV 98.7 02/25/2012   PLT 184.0 02/25/2012      Component Value Date/Time   NA 141 04/15/2012 1108   K 4.6 04/15/2012 1108   CL 105 04/15/2012 1108   CO2 28 04/15/2012 1108   GLUCOSE 109* 04/15/2012 1108   BUN 15 04/15/2012 1108   CREATININE 0.7 04/15/2012 1108   CALCIUM 9.6 04/15/2012 1108   PROT 7.3 09/25/2011 1456   ALBUMIN 3.9 09/25/2011 1456   AST 26 09/25/2011 1456   ALT 18 09/25/2011 1456   ALKPHOS 85 09/25/2011 1456   BILITOT 0.4 09/25/2011 1456   GFRNONAA 79* 03/02/2012 0640   GFRAA >90 03/02/2012 0640   Lab Results  Component Value Date   CHOL 226* 04/15/2012   HDL 63.90 04/15/2012   LDLDIRECT 150.4 04/15/2012   TRIG 113.0 04/15/2012   CHOLHDL 4 04/15/2012   Lab Results  Component Value Date   HGBA1C 5.7 04/29/2012   Lab Results  Component Value Date   VITAMINB12 698 04/29/2012   Lab Results  Component Value Date   TSH 1.11 12/31/2011       ASSESSMENT AND PLAN   79 years old right-handed Caucasian female, with bilateral hands tremor, bilateral lower extremity tremor, no rigidity, no weakness.  1.  most consistent with essential tremor, 2. I have suggested divided her clonazepam 1 mg to have tablets twice a day 3. home physical therapy.  4 return to clinic in 6 months .        Levert Feinstein, M.D. Ph.D.  [facility] [address] [place], [place] [address] [phone]

## 2012-11-11 ENCOUNTER — Ambulatory Visit (INDEPENDENT_AMBULATORY_CARE_PROVIDER_SITE_OTHER): Payer: Medicare Other | Admitting: Pharmacist

## 2012-11-11 DIAGNOSIS — I4891 Unspecified atrial fibrillation: Secondary | ICD-10-CM

## 2012-11-11 DIAGNOSIS — Z7901 Long term (current) use of anticoagulants: Secondary | ICD-10-CM

## 2012-11-23 ENCOUNTER — Telehealth: Payer: Self-pay | Admitting: Neurology

## 2012-11-23 DIAGNOSIS — F341 Dysthymic disorder: Secondary | ICD-10-CM

## 2012-11-23 DIAGNOSIS — H409 Unspecified glaucoma: Secondary | ICD-10-CM

## 2012-11-23 DIAGNOSIS — C50919 Malignant neoplasm of unspecified site of unspecified female breast: Secondary | ICD-10-CM

## 2012-11-23 NOTE — Telephone Encounter (Signed)
Spoke to the PT from [facility] and he asked for an order for social work.  The patient is interested in going to [facility].

## 2012-11-23 NOTE — Telephone Encounter (Signed)
Please call patient, order placed for social worker.

## 2012-11-24 NOTE — Telephone Encounter (Signed)
I spoke and told her she would be getting a call from Child psychotherapist.

## 2012-11-25 ENCOUNTER — Ambulatory Visit (INDEPENDENT_AMBULATORY_CARE_PROVIDER_SITE_OTHER): Payer: Medicare Other | Admitting: *Deleted

## 2012-11-25 DIAGNOSIS — I4891 Unspecified atrial fibrillation: Secondary | ICD-10-CM

## 2012-11-25 DIAGNOSIS — Z7901 Long term (current) use of anticoagulants: Secondary | ICD-10-CM

## 2012-11-25 LAB — POCT INR: INR: 1.5

## 2012-12-04 ENCOUNTER — Ambulatory Visit (INDEPENDENT_AMBULATORY_CARE_PROVIDER_SITE_OTHER): Payer: Medicare Other | Admitting: Pharmacist

## 2012-12-04 DIAGNOSIS — Z7901 Long term (current) use of anticoagulants: Secondary | ICD-10-CM

## 2012-12-04 DIAGNOSIS — I4891 Unspecified atrial fibrillation: Secondary | ICD-10-CM

## 2012-12-17 ENCOUNTER — Other Ambulatory Visit: Payer: Self-pay

## 2012-12-18 ENCOUNTER — Ambulatory Visit (INDEPENDENT_AMBULATORY_CARE_PROVIDER_SITE_OTHER): Payer: Medicare Other | Admitting: *Deleted

## 2012-12-18 DIAGNOSIS — Z7901 Long term (current) use of anticoagulants: Secondary | ICD-10-CM

## 2012-12-18 DIAGNOSIS — I4891 Unspecified atrial fibrillation: Secondary | ICD-10-CM

## 2013-01-04 ENCOUNTER — Ambulatory Visit (INDEPENDENT_AMBULATORY_CARE_PROVIDER_SITE_OTHER): Payer: Medicare Other | Admitting: Pharmacist

## 2013-01-04 DIAGNOSIS — I4891 Unspecified atrial fibrillation: Secondary | ICD-10-CM

## 2013-01-04 DIAGNOSIS — Z7901 Long term (current) use of anticoagulants: Secondary | ICD-10-CM

## 2013-02-10 ENCOUNTER — Ambulatory Visit (INDEPENDENT_AMBULATORY_CARE_PROVIDER_SITE_OTHER): Payer: Medicare Other | Admitting: *Deleted

## 2013-02-10 DIAGNOSIS — I4891 Unspecified atrial fibrillation: Secondary | ICD-10-CM

## 2013-02-10 DIAGNOSIS — Z7901 Long term (current) use of anticoagulants: Secondary | ICD-10-CM

## 2013-02-10 LAB — POCT INR: INR: 1.2

## 2013-02-17 ENCOUNTER — Ambulatory Visit (INDEPENDENT_AMBULATORY_CARE_PROVIDER_SITE_OTHER): Payer: Medicare Other | Admitting: *Deleted

## 2013-02-17 DIAGNOSIS — Z7901 Long term (current) use of anticoagulants: Secondary | ICD-10-CM

## 2013-02-17 DIAGNOSIS — I4891 Unspecified atrial fibrillation: Secondary | ICD-10-CM

## 2013-02-17 LAB — POCT INR: INR: 4.1

## 2013-02-23 ENCOUNTER — Encounter: Payer: Self-pay | Admitting: *Deleted

## 2013-02-24 ENCOUNTER — Ambulatory Visit (INDEPENDENT_AMBULATORY_CARE_PROVIDER_SITE_OTHER): Payer: Medicare Other | Admitting: Pharmacist

## 2013-02-24 DIAGNOSIS — Z7901 Long term (current) use of anticoagulants: Secondary | ICD-10-CM

## 2013-02-24 DIAGNOSIS — I4891 Unspecified atrial fibrillation: Secondary | ICD-10-CM

## 2013-02-24 LAB — POCT INR: INR: 4.9

## 2013-03-03 ENCOUNTER — Ambulatory Visit (INDEPENDENT_AMBULATORY_CARE_PROVIDER_SITE_OTHER): Payer: Medicare Other | Admitting: *Deleted

## 2013-03-03 DIAGNOSIS — I4891 Unspecified atrial fibrillation: Secondary | ICD-10-CM

## 2013-03-03 DIAGNOSIS — Z7901 Long term (current) use of anticoagulants: Secondary | ICD-10-CM

## 2013-03-03 LAB — POCT INR: INR: 1.3

## 2013-03-04 ENCOUNTER — Encounter: Payer: Self-pay | Admitting: Internal Medicine

## 2013-03-04 ENCOUNTER — Ambulatory Visit (INDEPENDENT_AMBULATORY_CARE_PROVIDER_SITE_OTHER): Payer: Medicare Other | Admitting: *Deleted

## 2013-03-04 DIAGNOSIS — I498 Other specified cardiac arrhythmias: Secondary | ICD-10-CM

## 2013-03-04 LAB — MDC_IDC_ENUM_SESS_TYPE_REMOTE
Lead Channel Impedance Value: 480 Ohm
Lead Channel Sensing Intrinsic Amplitude: 5.6 mV
Lead Channel Setting Pacing Amplitude: 1 V
Lead Channel Setting Pacing Pulse Width: 0.4 ms
MDC IDC MSMT BATTERY REMAINING LONGEVITY: 114 mo
MDC IDC MSMT BATTERY VOLTAGE: 2.96 V
MDC IDC MSMT LEADCHNL RV PACING THRESHOLD AMPLITUDE: 0.75 V
MDC IDC MSMT LEADCHNL RV PACING THRESHOLD PULSEWIDTH: 0.4 ms
MDC IDC PG SERIAL: 7209799
MDC IDC SESS DTM: 20150121080735
MDC IDC SET LEADCHNL RV SENSING SENSITIVITY: 2.5 mV
MDC IDC STAT BRADY RV PERCENT PACED: 82 %

## 2013-03-15 ENCOUNTER — Ambulatory Visit (INDEPENDENT_AMBULATORY_CARE_PROVIDER_SITE_OTHER): Payer: Medicare Other | Admitting: *Deleted

## 2013-03-15 ENCOUNTER — Ambulatory Visit (INDEPENDENT_AMBULATORY_CARE_PROVIDER_SITE_OTHER): Payer: Medicare Other | Admitting: Internal Medicine

## 2013-03-15 ENCOUNTER — Encounter: Payer: Self-pay | Admitting: Internal Medicine

## 2013-03-15 VITALS — BP 163/94 | HR 72 | Ht 62.0 in | Wt 127.0 lb

## 2013-03-15 DIAGNOSIS — Z950 Presence of cardiac pacemaker: Secondary | ICD-10-CM

## 2013-03-15 DIAGNOSIS — I472 Ventricular tachycardia, unspecified: Secondary | ICD-10-CM

## 2013-03-15 DIAGNOSIS — I4891 Unspecified atrial fibrillation: Secondary | ICD-10-CM

## 2013-03-15 DIAGNOSIS — I442 Atrioventricular block, complete: Secondary | ICD-10-CM

## 2013-03-15 DIAGNOSIS — I4729 Other ventricular tachycardia: Secondary | ICD-10-CM

## 2013-03-15 DIAGNOSIS — Z7901 Long term (current) use of anticoagulants: Secondary | ICD-10-CM

## 2013-03-15 DIAGNOSIS — Z5181 Encounter for therapeutic drug level monitoring: Secondary | ICD-10-CM

## 2013-03-15 DIAGNOSIS — I5032 Chronic diastolic (congestive) heart failure: Secondary | ICD-10-CM

## 2013-03-15 HISTORY — DX: Ventricular tachycardia, unspecified: I47.20

## 2013-03-15 HISTORY — DX: Ventricular tachycardia: I47.2

## 2013-03-15 LAB — MDC_IDC_ENUM_SESS_TYPE_INCLINIC
Brady Statistic RA Percent Paced: 0 %
Implantable Pulse Generator Model: 2210
Lead Channel Impedance Value: 487.5 Ohm
Lead Channel Pacing Threshold Amplitude: 0.75 V
Lead Channel Pacing Threshold Pulse Width: 0.4 ms
Lead Channel Pacing Threshold Pulse Width: 0.4 ms
Lead Channel Setting Pacing Amplitude: 1 V
MDC IDC MSMT BATTERY REMAINING LONGEVITY: 123.6 mo
MDC IDC MSMT BATTERY VOLTAGE: 2.98 V
MDC IDC MSMT LEADCHNL RA SENSING INTR AMPL: 2 mV
MDC IDC MSMT LEADCHNL RV PACING THRESHOLD AMPLITUDE: 0.75 V
MDC IDC MSMT LEADCHNL RV SENSING INTR AMPL: 6.4 mV
MDC IDC PG SERIAL: 7209799
MDC IDC SESS DTM: 20150202164238
MDC IDC SET LEADCHNL RV PACING PULSEWIDTH: 0.4 ms
MDC IDC SET LEADCHNL RV SENSING SENSITIVITY: 2.5 mV
MDC IDC STAT BRADY RV PERCENT PACED: 81 %

## 2013-03-15 LAB — BASIC METABOLIC PANEL
BUN: 15 mg/dL (ref 6–23)
CALCIUM: 9.5 mg/dL (ref 8.4–10.5)
CO2: 23 mEq/L (ref 19–32)
CREATININE: 0.8 mg/dL (ref 0.4–1.2)
Chloride: 106 mEq/L (ref 96–112)
GFR: 74.18 mL/min (ref >60.00)
GLUCOSE: 90 mg/dL (ref 70–99)
POTASSIUM: 4.4 meq/L (ref 3.5–5.1)
Sodium: 138 mEq/L (ref 135–145)

## 2013-03-15 LAB — MAGNESIUM: Magnesium: 2.1 mg/dL (ref 1.5–2.5)

## 2013-03-15 LAB — POCT INR: INR: 1.7

## 2013-03-15 NOTE — Assessment & Plan Note (Signed)
The patient's device was interrogated and the information was fully reviewed.  The device was reprogrammed to  Increase lower rate limit 70->>75

## 2013-03-15 NOTE — Patient Instructions (Signed)
Your physician recommends that you continue on your current medications as directed. Please refer to the Current Medication list given to you today.  Your physician recommends that you have lab work today: BMET, Magnesium  Remote monitoring is used to monitor your Pacemaker of ICD from home. This monitoring reduces the number of office visits required to check your device to one time per year. It allows [place] to keep an eye on the functioning of your device to ensure it is working properly. You are scheduled for a device check from home on 06/15/13. You may send your transmission at any time that day. If you have a wireless device, the transmission will be sent automatically. After your physician reviews your transmission, you will receive a postcard with your next transmission date.  Your physician wants you to follow-up in: 6 months with Dr Gari Crown will receive a reminder letter in the mail two months in advance. If you don't receive a letter, please call our office to schedule the follow-up appointment.

## 2013-03-15 NOTE — Assessment & Plan Note (Signed)
stable °

## 2013-03-15 NOTE — Assessment & Plan Note (Signed)
Noted on her device  Will check k and mag

## 2013-03-15 NOTE — Progress Notes (Signed)
Patient Care Team: Jefm Petty as PCP - General (Family Medicine) Deboraha Sprang, MD as Consulting Physician (Cardiology)   HPI  [name] is a 80 y.o. female With chronic shortness of breath who underwent AV junction ablation for atrial fibrillation with a rapid rate that was uncontrollable.  She continues to have complaints of significant shortness of breath ; she has been seeing Dr. Melvyn Novas Evaluation which has been unrevealing.    She continues to struggle with fatigue Myoview 2014 demonstrated low risk scan with no scar or ischemia  Interval echo demonstrated normal left ventricular function and only mild postsurgical MR    Past Medical History  Diagnosis Date  . Complete AV block s/p AV ablation   . ACID REFLUX DISEASE   . ANEMIA-NOS   . Atrial fibrillation   . BREAST CANCER   . Chronic diastolic heart failure     EF normal 2014 Echo; myoview Neg  . COPD   . DEPRESSION/ANXIETY   . DIVERTICULITIS, HX OF   . DYSGEUSIA   . Essential hypertension, benign   . FATIGUE   . GLAUCOMA   . Shortness of breath   . Pacemaker Medtronic   . LYMPHEDEMA, RIGHT ARM   . Tremor   . Gait disturbance   . Memory loss     Past Surgical History  Procedure Laterality Date  . Mitral valve repair  2002  . Esophagogastrodenoscopy    . Partial colectomy    . Shoulder surgery    . Mastectomy      right breast   . Cardioversion  01/21/2011    Procedure: CARDIOVERSION;  Surgeon: Deboraha Sprang, MD;  Location: [place];  Service: Cardiovascular;  Laterality: N/A;  OUT PATIENT CARDIOVERSION  . Cardioversion  05/16/2011    Procedure: CARDIOVERSION;  Surgeon: Deboraha Sprang, MD;  Location: [place];  Service: Cardiovascular;  Laterality: N/A;  . Pacemaker insertion  2012  . Cardioversion  01/02/2012    Procedure: CARDIOVERSION;  Surgeon: Renella Cunas, MD;  Location: [facility] ENDOSCOPY;  Service: Cardiovascular;  Laterality: N/A;  Dr. Caryl Comes will be coming over to do Cardioversion    Current  Outpatient Prescriptions  Medication Sig Dispense Refill  . B Complex Vitamins (VITAMIN B COMPLEX PO) Take by mouth daily.      . brimonidine (ALPHAGAN) 0.15 % ophthalmic solution Place 1 drop into both eyes 2 (two) times daily.       . clonazePAM (KLONOPIN) 1 MG tablet Take 1 mg by mouth daily as needed. For anxiety      . Docusate Sodium (DULCOLAX STOOL SOFTENER PO) Take 1 capsule by mouth daily.       . mometasone (NASONEX) 50 MCG/ACT nasal spray Place 2 sprays into the nose daily.      . sertraline (ZOLOFT) 50 MG tablet Take 50 mg by mouth daily.      Marland Kitchen warfarin (COUMADIN) 5 MG tablet Take as directed by coumadin clinic  30 tablet  3  . zolpidem (AMBIEN) 10 MG tablet Take 10 mg by mouth at bedtime.        No current facility-administered medications for this visit.    Allergies  Allergen Reactions  . Amiodarone   . Citalopram Hydrobromide Other (See Comments)    Causes depression  . Codeine     REACTION: Nausea and cold sweats  . Cymbalta [Duloxetine Hcl] Other (See Comments)    Nausea nervousness  . Diltiazem Other (See Comments)    Doesn't  remember  . Multaq [Dronedarone Hydrochloride] Other (See Comments)    unknown  . Pristiq [Desvenlafaxine Succinate Monohydrate] Other (See Comments)    palpitations  . Propafenone Hcl     REACTION: Reaction not known  . Tramadol Other (See Comments)    unknown    Review of Systems negative except from HPI and PMH  Physical Exam BP 163/94  Pulse 72  Ht 5\' 2"  (1.575 m)  Wt 127 lb (57.607 kg)  BMI 23.22 kg/m2 Well developed and well nourished in no acute distress HENT normal E scleral and icterus clear Neck Supple JVP flat;  Clear to ausculation Device pocket well healed; without hematoma or erythema.  There is no tetheringegular rate and rhythm, no murmurs gallop s or rub Soft   No clubbing cyanosis none Edema Alert and oriented, grossly normal motor and sensory function Skin Warm and Dry    Assessment and  Plan

## 2013-03-15 NOTE — Assessment & Plan Note (Signed)
Permanent S/p avablatn

## 2013-03-29 ENCOUNTER — Encounter: Payer: Self-pay | Admitting: Internal Medicine

## 2013-04-20 ENCOUNTER — Telehealth: Payer: Self-pay | Admitting: Neurology

## 2013-04-20 NOTE — Telephone Encounter (Signed)
I called and LMVM for pt that returning her call. She was on another line.

## 2013-04-20 NOTE — Telephone Encounter (Signed)
Patient woke up this morning with really bad tremors--has appt 3-19 but would like to be seen today if possible--please call

## 2013-04-21 ENCOUNTER — Ambulatory Visit (INDEPENDENT_AMBULATORY_CARE_PROVIDER_SITE_OTHER): Payer: Medicare Other | Admitting: *Deleted

## 2013-04-21 ENCOUNTER — Other Ambulatory Visit: Payer: Self-pay | Admitting: *Deleted

## 2013-04-21 DIAGNOSIS — Z7901 Long term (current) use of anticoagulants: Secondary | ICD-10-CM

## 2013-04-21 DIAGNOSIS — I4891 Unspecified atrial fibrillation: Secondary | ICD-10-CM

## 2013-04-21 DIAGNOSIS — Z950 Presence of cardiac pacemaker: Secondary | ICD-10-CM

## 2013-04-21 DIAGNOSIS — Z5181 Encounter for therapeutic drug level monitoring: Secondary | ICD-10-CM

## 2013-04-21 LAB — POCT INR: INR: 1.3

## 2013-04-21 MED ORDER — WARFARIN SODIUM 5 MG PO TABS: ORAL_TABLET | ORAL | Status: DC

## 2013-04-21 NOTE — Telephone Encounter (Signed)
TC to pt, LMVM for her that tried to call yesterday, call back and will see if can see her sooner then 04-29-13.

## 2013-04-21 NOTE — Progress Notes (Signed)
Quick check per CVRR due to pt c/o of dyspnea upon exertion. No alerts or episodes. Histogram normal for pt. Pt's presenting rhythm is trigeminal Vsensing amidst 83% Vpacing. Pt has chronic afib and understands her arrhythmia contributes to her symptoms.   No programming changes.  Next remote 06/15/13 & ROV w/ Dr. Caryl Comes 09/2013.

## 2013-04-28 ENCOUNTER — Ambulatory Visit (INDEPENDENT_AMBULATORY_CARE_PROVIDER_SITE_OTHER): Payer: Medicare Other | Admitting: Pharmacist

## 2013-04-28 DIAGNOSIS — Z5181 Encounter for therapeutic drug level monitoring: Secondary | ICD-10-CM

## 2013-04-28 DIAGNOSIS — I4891 Unspecified atrial fibrillation: Secondary | ICD-10-CM

## 2013-04-28 DIAGNOSIS — Z7901 Long term (current) use of anticoagulants: Secondary | ICD-10-CM

## 2013-04-28 LAB — POCT INR: INR: 2.4

## 2013-04-29 ENCOUNTER — Encounter: Payer: Self-pay | Admitting: Neurology

## 2013-04-29 ENCOUNTER — Ambulatory Visit (INDEPENDENT_AMBULATORY_CARE_PROVIDER_SITE_OTHER): Payer: Medicare Other | Admitting: Neurology

## 2013-04-29 VITALS — BP 128/65 | HR 62 | Ht 61.0 in | Wt 120.0 lb

## 2013-04-29 DIAGNOSIS — R259 Unspecified abnormal involuntary movements: Secondary | ICD-10-CM

## 2013-04-29 DIAGNOSIS — R413 Other amnesia: Secondary | ICD-10-CM

## 2013-04-29 DIAGNOSIS — R251 Tremor, unspecified: Secondary | ICD-10-CM

## 2013-04-29 DIAGNOSIS — K219 Gastro-esophageal reflux disease without esophagitis: Secondary | ICD-10-CM

## 2013-04-29 MED ORDER — CLONAZEPAM 0.5 MG PO TABS: 0.5000 mg | ORAL_TABLET | Freq: Two times a day (BID) | ORAL | Status: DC | PRN

## 2013-04-29 MED ORDER — SERTRALINE HCL 50 MG PO TABS: 50.0000 mg | ORAL_TABLET | Freq: Every day | ORAL | Status: AC

## 2013-04-29 MED ORDER — ZOLPIDEM TARTRATE 10 MG PO TABS: 10.0000 mg | ORAL_TABLET | Freq: Every evening | ORAL | Status: DC | PRN

## 2013-04-29 NOTE — Progress Notes (Signed)
[facility]  PATIENT: [name] DOB: September 19, 1923  HISTORICAL  Initial visit:  [name] is a 80 years old right-handed Caucasian female, referred by her cardiologist is Dr. Caryl Comes for evaluation of bilateral hands tremor, she is accompanied by her daughter at today's clinical visit   She has past medical history of pacemaker placement, atrial fibrillation, on chronic Coumadin treatment, history of right breast cancer, status post lobectomy, in 1990s, gait disorder, hypertension, she denies family history of tremor,  She still lives alone in her apartment, ambulate with a cane, over past 1 year, since 2013, she noticed bilateral hands tremor, especially when holding a cup of coffee, using her utensils, or writing, she also complains of bilateral lower extremity shaky when bearing weight.   She denied lost sense of smell, denies significant memory trouble, she sleeps well, had vivid dreams,  She is taking clonazepam 1 mg every morning, daughter noticed that she was drowsy, legs were heavy after taking medications  UPDATE March 19th 2015: She was noted to have hands shaking,  Her gait continues to get worse, left muscle fasiculation. "The shaking has taken up my life",   Gabapentin 300mg  qid was provided by her psychologist, without help,   She has run out of her Ambien, clonazepam, Zoloft for few weeks, for the reason not sure, she also complains of inside jitterish, worsening anxiety  REVIEW OF SYSTEMS: Full 14 system review of systems performed and notable only for chill, fatigue, fever, runny nose, eye itching, double vision, speech difficulty, depression, anxious, suicidal thoughts   ALLERGIES: Allergies  Allergen Reactions  . Amiodarone   . Citalopram Hydrobromide Other (See Comments)    Causes depression  . Codeine     REACTION: Nausea and cold sweats  . Cymbalta [Duloxetine Hcl] Other (See Comments)    Nausea nervousness  . Diltiazem Other (See  Comments)    Doesn't remember  . Multaq [Dronedarone Hydrochloride] Other (See Comments)    unknown  . Pristiq [Desvenlafaxine Succinate Monohydrate] Other (See Comments)    palpitations  . Propafenone Hcl     REACTION: Reaction not known  . Tramadol Other (See Comments)    unknown    HOME MEDICATIONS: Outpatient Prescriptions Prior to Visit  Medication Sig Dispense Refill  . B Complex Vitamins (VITAMIN B COMPLEX PO) Take by mouth daily.      . brimonidine (ALPHAGAN) 0.15 % ophthalmic solution Place 1 drop into both eyes 2 (two) times daily.       . clonazePAM (KLONOPIN) 1 MG tablet Take 1 mg by mouth daily as needed. For anxiety      . Docusate Sodium (DULCOLAX STOOL SOFTENER PO) Take 1 capsule by mouth daily.       . mometasone (NASONEX) 50 MCG/ACT nasal spray Place 2 sprays into the nose daily.      . sertraline (ZOLOFT) 50 MG tablet Take 50 mg by mouth daily.      Marland Kitchen warfarin (COUMADIN) 5 MG tablet Take as directed by coumadin clinic  40 tablet  3  . zolpidem (AMBIEN) 10 MG tablet Take 10 mg by mouth at bedtime.        No facility-administered medications prior to visit.    PAST MEDICAL HISTORY: Past Medical History  Diagnosis Date  . Complete AV block s/p AV ablation   . ACID REFLUX DISEASE   . ANEMIA-NOS   . Atrial fibrillation   . BREAST CANCER, in 1990s, right   . Chronic diastolic heart  failure   . COPD   . DEPRESSION/ANXIETY   . DIVERTICULITIS, HX OF   . DYSGEUSIA   . Essential hypertension, benign   . FATIGUE   . GLAUCOMA   . Shortness of breath   . Pacemaker Medtronic   . LYMPHEDEMA, RIGHT ARM   . Tremor   . Gait disturbance   . Memory loss     PAST SURGICAL HISTORY: Past Surgical History  Procedure Laterality Date  . Mitral valve repair  2002  . Esophagogastrodenoscopy    . Partial colectomy    . Shoulder surgery    . Mastectomy      right breast   . Cardioversion  01/21/2011    Procedure: CARDIOVERSION;  Surgeon: Deboraha Sprang, MD;  Location:  [place];  Service: Cardiovascular;  Laterality: N/A;  OUT PATIENT CARDIOVERSION  . Cardioversion  05/16/2011    Procedure: CARDIOVERSION;  Surgeon: Deboraha Sprang, MD;  Location: [place];  Service: Cardiovascular;  Laterality: N/A;  . Pacemaker insertion  2012  . Cardioversion  01/02/2012    Procedure: CARDIOVERSION;  Surgeon: Renella Cunas, MD;  Location: [facility] ENDOSCOPY;  Service: Cardiovascular;  Laterality: N/A;  Dr. Caryl Comes will be coming over to do Cardioversion    FAMILY HISTORY: Family History  Problem Relation Age of Onset  . Other Mother 62    Died from old age  . Other Father 56    car accident  . Uterine cancer Sister 1  . Asthma Son 106    SOCIAL HISTORY:  History   Social History  . Marital Status: Widowed    Spouse Name: N/A    Number of Children: 10  . Years of Education: college   Occupational History    Retired   Social History Main Topics  . Smoking status: Never Smoker   . Smokeless tobacco: Never Used  . Alcohol Use: 0.6 oz/week    1 Glasses of wine per week     Comment: glass of wine everyday  . Drug Use: No  . Sexual Activity: Not on file    Social History Narrative   Patient is widowed and she is retired.  Patient lives at home alone. Patient has two years of college education.   Right handed.   Caffeine- one cup of coffee daily.     PHYSICAL EXAM   Filed Vitals:   04/29/13 1543  BP: 128/65  Pulse: 62  Height: 5\' 1"  (1.549 m)  Weight: 120 lb (54.432 kg)    Not recorded    Body mass index is 22.69 kg/(m^2).   Generalized: In no acute distress  Neck: Supple, no carotid bruits   Cardiac: Regular rate rhythm  Pulmonary: Clear to auscultation bilaterally  Musculoskeletal: No deformity  Neurological examination  Mentation: Alert oriented to time, place, history taking, and causual conversation, anxious-looking elderly female,   Cranial nerve II-XII: Pupils were equal round reactive to light extraocular movements were full, visual  field were full on confrontational test. facial sensation and strength were normal. hearing was intact to finger rubbing bilaterally. Uvula tongue midline.  head turning and shoulder shrug and were normal and symmetric.Tongue protrusion into cheek strength was normal.  Motor: normal tone, bulk and strength she has mild bilateral hand posture tremor, there was no rigidity.  Sensory: Intact to fine touch, pinprick, preserved vibratory sensation, and proprioception at toes.  Coordination: Normal finger to nose, heel-to-shin bilaterally there was no truncal ataxia  Gait: Need to push up from seated position,  cautious, unsteady gait,  Romberg signs: Negative  Deep tendon reflexes: Brachioradialis 2/2, biceps 2/2, triceps 2/2, patellar 2/2, Achilles trace, plantar responses were flexor bilaterally.   DIAGNOSTIC DATA (LABS, IMAGING, TESTING) - I reviewed patient records, labs, notes, testing and imaging myself where available.  Lab Results  Component Value Date   WBC 6.5 02/25/2012   HGB 15.9* 02/25/2012   HCT 46.0 02/25/2012   MCV 98.7 02/25/2012   PLT 184.0 02/25/2012      Component Value Date/Time   NA 138 03/15/2013 1507   K 4.4 03/15/2013 1507   CL 106 03/15/2013 1507   CO2 23 03/15/2013 1507   GLUCOSE 90 03/15/2013 1507   BUN 15 03/15/2013 1507   CREATININE 0.8 03/15/2013 1507   CALCIUM 9.5 03/15/2013 1507   PROT 7.3 09/25/2011 1456   ALBUMIN 3.9 09/25/2011 1456   AST 26 09/25/2011 1456   ALT 18 09/25/2011 1456   ALKPHOS 85 09/25/2011 1456   BILITOT 0.4 09/25/2011 1456   GFRNONAA 79* 03/02/2012 0640   GFRAA >90 03/02/2012 0640   Lab Results  Component Value Date   CHOL 226* 04/15/2012   HDL 63.90 04/15/2012   LDLDIRECT 150.4 04/15/2012   TRIG 113.0 04/15/2012   CHOLHDL 4 04/15/2012   Lab Results  Component Value Date   HGBA1C 5.7 04/29/2012   Lab Results  Component Value Date   VITAMINB12 698 04/29/2012   Lab Results  Component Value Date   TSH 1.11 12/31/2011      ASSESSMENT AND PLAN   80  years old right-handed Caucasian female, with bilateral hands tremor, bilateral lower extremity tremor, no rigidity, no weakness.  1.  most consistent with essential tremor, 2. I have refilled her medications, including clonazepam 0 point 5 mg twice a day, Ambien 10 mg every night as needed, Zoloft 50 mg every day 3, she should continue to refill her medications primary care physician, only return to clinic in one year, with Hoyle Sauer   4, I will refer her to home physical therapy.   Marcial Pacas, M.D. Ph.D.  [facility] [address] [place], [place] [address] [phone]

## 2013-05-07 ENCOUNTER — Ambulatory Visit: Payer: Medicare Other | Admitting: Neurology

## 2013-05-12 ENCOUNTER — Ambulatory Visit (INDEPENDENT_AMBULATORY_CARE_PROVIDER_SITE_OTHER): Payer: Medicare Other | Admitting: *Deleted

## 2013-05-12 DIAGNOSIS — Z5181 Encounter for therapeutic drug level monitoring: Secondary | ICD-10-CM

## 2013-05-12 DIAGNOSIS — Z7901 Long term (current) use of anticoagulants: Secondary | ICD-10-CM

## 2013-05-12 DIAGNOSIS — I4891 Unspecified atrial fibrillation: Secondary | ICD-10-CM

## 2013-05-12 LAB — PROTIME-INR
INR: 7.1 ratio (ref 0.8–1.0)
PROTHROMBIN TIME: 72.8 s — AB (ref 10.2–12.4)

## 2013-05-12 LAB — POCT INR: INR: 6.8

## 2013-05-12 NOTE — Telephone Encounter (Signed)
Received call from [facility] patient's INR 7.1 PT 72.75.Jeremy Smart was notified.

## 2013-05-17 ENCOUNTER — Ambulatory Visit (INDEPENDENT_AMBULATORY_CARE_PROVIDER_SITE_OTHER): Payer: Medicare Other

## 2013-05-17 DIAGNOSIS — Z5181 Encounter for therapeutic drug level monitoring: Secondary | ICD-10-CM

## 2013-05-17 DIAGNOSIS — I4891 Unspecified atrial fibrillation: Secondary | ICD-10-CM

## 2013-05-17 DIAGNOSIS — Z7901 Long term (current) use of anticoagulants: Secondary | ICD-10-CM

## 2013-05-17 LAB — POCT INR: INR: 1.4

## 2013-05-18 ENCOUNTER — Encounter: Payer: Self-pay | Admitting: Internal Medicine

## 2013-05-24 ENCOUNTER — Ambulatory Visit (INDEPENDENT_AMBULATORY_CARE_PROVIDER_SITE_OTHER): Payer: Medicare Other | Admitting: Pharmacist

## 2013-05-24 DIAGNOSIS — Z7901 Long term (current) use of anticoagulants: Secondary | ICD-10-CM

## 2013-05-24 DIAGNOSIS — I4891 Unspecified atrial fibrillation: Secondary | ICD-10-CM

## 2013-05-24 DIAGNOSIS — Z5181 Encounter for therapeutic drug level monitoring: Secondary | ICD-10-CM

## 2013-05-24 LAB — POCT INR: INR: 2.6

## 2013-06-08 ENCOUNTER — Ambulatory Visit (INDEPENDENT_AMBULATORY_CARE_PROVIDER_SITE_OTHER): Payer: Medicare Other

## 2013-06-08 DIAGNOSIS — I4891 Unspecified atrial fibrillation: Secondary | ICD-10-CM

## 2013-06-08 DIAGNOSIS — Z5181 Encounter for therapeutic drug level monitoring: Secondary | ICD-10-CM

## 2013-06-08 DIAGNOSIS — Z7901 Long term (current) use of anticoagulants: Secondary | ICD-10-CM

## 2013-06-08 LAB — POCT INR: INR: 3.5

## 2013-06-15 ENCOUNTER — Encounter: Payer: Medicare Other | Admitting: *Deleted

## 2013-06-22 ENCOUNTER — Telehealth: Payer: Self-pay | Admitting: Cardiology

## 2013-06-22 ENCOUNTER — Encounter: Payer: Medicare Other | Admitting: *Deleted

## 2013-06-22 NOTE — Telephone Encounter (Signed)
LMOVM for pt to send remote transmission for device today. Also sent patient a [employer].

## 2013-06-23 ENCOUNTER — Encounter: Payer: Self-pay | Admitting: Cardiology

## 2013-07-08 ENCOUNTER — Ambulatory Visit (INDEPENDENT_AMBULATORY_CARE_PROVIDER_SITE_OTHER): Payer: Medicare Other | Admitting: *Deleted

## 2013-07-08 ENCOUNTER — Telehealth: Payer: Self-pay | Admitting: Physician Assistant

## 2013-07-08 DIAGNOSIS — Z5181 Encounter for therapeutic drug level monitoring: Secondary | ICD-10-CM

## 2013-07-08 DIAGNOSIS — Z7901 Long term (current) use of anticoagulants: Secondary | ICD-10-CM

## 2013-07-08 DIAGNOSIS — I4891 Unspecified atrial fibrillation: Secondary | ICD-10-CM

## 2013-07-08 LAB — PROTIME-INR
INR: 5.73 (ref <1.50)
Prothrombin Time: 49.3 seconds — ABNORMAL HIGH (ref 11.6–15.2)

## 2013-07-08 LAB — POCT INR: INR: 7.5

## 2013-07-08 NOTE — Telephone Encounter (Signed)
The lab called and INR is supratherapeutic.  I left a message on the patient's home voicemail to hold coumadin for the next two days.  Will need to repeat INR on Monday.  Tarri Fuller, PA-C

## 2013-07-09 ENCOUNTER — Telehealth: Payer: Self-pay | Admitting: Physician Assistant

## 2013-07-09 NOTE — Telephone Encounter (Signed)
The patient did not get my message last night to hold coumadin.  I spoke to her this morning and she will hold it F, S, Su and get her INR checked on Monday in the coumadin clinic.  Tarri Fuller, PA-C

## 2013-07-12 ENCOUNTER — Ambulatory Visit (INDEPENDENT_AMBULATORY_CARE_PROVIDER_SITE_OTHER): Payer: Medicare Other | Admitting: *Deleted

## 2013-07-12 DIAGNOSIS — Z5181 Encounter for therapeutic drug level monitoring: Secondary | ICD-10-CM

## 2013-07-12 DIAGNOSIS — Z7901 Long term (current) use of anticoagulants: Secondary | ICD-10-CM

## 2013-07-12 DIAGNOSIS — I4891 Unspecified atrial fibrillation: Secondary | ICD-10-CM

## 2013-07-12 LAB — POCT INR: INR: 1.6

## 2013-07-23 ENCOUNTER — Ambulatory Visit (INDEPENDENT_AMBULATORY_CARE_PROVIDER_SITE_OTHER): Payer: Medicare Other

## 2013-07-23 DIAGNOSIS — I4891 Unspecified atrial fibrillation: Secondary | ICD-10-CM

## 2013-07-23 DIAGNOSIS — Z7901 Long term (current) use of anticoagulants: Secondary | ICD-10-CM

## 2013-07-23 DIAGNOSIS — Z5181 Encounter for therapeutic drug level monitoring: Secondary | ICD-10-CM

## 2013-07-23 LAB — PROTIME-INR
INR: 6 ratio (ref 0.8–1.0)
Prothrombin Time: 63 s (ref 9.6–13.1)

## 2013-07-23 LAB — POCT INR: INR: 6.4

## 2013-07-27 ENCOUNTER — Ambulatory Visit (INDEPENDENT_AMBULATORY_CARE_PROVIDER_SITE_OTHER): Payer: Medicare Other

## 2013-07-27 DIAGNOSIS — I4891 Unspecified atrial fibrillation: Secondary | ICD-10-CM

## 2013-07-27 DIAGNOSIS — Z5181 Encounter for therapeutic drug level monitoring: Secondary | ICD-10-CM

## 2013-07-27 DIAGNOSIS — Z7901 Long term (current) use of anticoagulants: Secondary | ICD-10-CM

## 2013-07-27 LAB — POCT INR: INR: 1.7

## 2013-08-03 ENCOUNTER — Ambulatory Visit (INDEPENDENT_AMBULATORY_CARE_PROVIDER_SITE_OTHER): Payer: Medicare Other | Admitting: *Deleted

## 2013-08-03 DIAGNOSIS — Z5181 Encounter for therapeutic drug level monitoring: Secondary | ICD-10-CM

## 2013-08-03 DIAGNOSIS — I4891 Unspecified atrial fibrillation: Secondary | ICD-10-CM

## 2013-08-03 DIAGNOSIS — Z7901 Long term (current) use of anticoagulants: Secondary | ICD-10-CM

## 2013-08-03 LAB — POCT INR: INR: 4.9

## 2013-08-20 ENCOUNTER — Ambulatory Visit (INDEPENDENT_AMBULATORY_CARE_PROVIDER_SITE_OTHER): Payer: Medicare Other | Admitting: Pharmacist

## 2013-08-20 DIAGNOSIS — Z5181 Encounter for therapeutic drug level monitoring: Secondary | ICD-10-CM

## 2013-08-20 DIAGNOSIS — Z7901 Long term (current) use of anticoagulants: Secondary | ICD-10-CM

## 2013-08-20 DIAGNOSIS — I4891 Unspecified atrial fibrillation: Secondary | ICD-10-CM

## 2013-08-20 LAB — POCT INR: INR: 3.2

## 2013-09-03 ENCOUNTER — Ambulatory Visit (INDEPENDENT_AMBULATORY_CARE_PROVIDER_SITE_OTHER): Payer: Medicare Other | Admitting: Pharmacist

## 2013-09-03 DIAGNOSIS — Z7901 Long term (current) use of anticoagulants: Secondary | ICD-10-CM

## 2013-09-03 DIAGNOSIS — Z5181 Encounter for therapeutic drug level monitoring: Secondary | ICD-10-CM

## 2013-09-03 DIAGNOSIS — I4891 Unspecified atrial fibrillation: Secondary | ICD-10-CM

## 2013-09-03 LAB — POCT INR: INR: 4.3

## 2013-09-14 ENCOUNTER — Encounter: Payer: Self-pay | Admitting: Internal Medicine

## 2013-09-14 ENCOUNTER — Ambulatory Visit (INDEPENDENT_AMBULATORY_CARE_PROVIDER_SITE_OTHER): Payer: Medicare Other | Admitting: Pharmacist Clinician (PhC)/ Clinical Pharmacy Specialist

## 2013-09-14 ENCOUNTER — Ambulatory Visit (INDEPENDENT_AMBULATORY_CARE_PROVIDER_SITE_OTHER): Payer: Medicare Other | Admitting: Internal Medicine

## 2013-09-14 VITALS — BP 137/88 | HR 87 | Ht 62.0 in

## 2013-09-14 DIAGNOSIS — I472 Ventricular tachycardia, unspecified: Secondary | ICD-10-CM

## 2013-09-14 DIAGNOSIS — Z5181 Encounter for therapeutic drug level monitoring: Secondary | ICD-10-CM

## 2013-09-14 DIAGNOSIS — I4729 Other ventricular tachycardia: Secondary | ICD-10-CM

## 2013-09-14 DIAGNOSIS — Z950 Presence of cardiac pacemaker: Secondary | ICD-10-CM

## 2013-09-14 DIAGNOSIS — I442 Atrioventricular block, complete: Secondary | ICD-10-CM

## 2013-09-14 DIAGNOSIS — I4891 Unspecified atrial fibrillation: Secondary | ICD-10-CM

## 2013-09-14 DIAGNOSIS — I482 Chronic atrial fibrillation, unspecified: Secondary | ICD-10-CM

## 2013-09-14 DIAGNOSIS — Z7901 Long term (current) use of anticoagulants: Secondary | ICD-10-CM

## 2013-09-14 LAB — MDC_IDC_ENUM_SESS_TYPE_INCLINIC
Battery Voltage: 2.96 V
Brady Statistic RA Percent Paced: 0 %
Brady Statistic RV Percent Paced: 81 %
Date Time Interrogation Session: 20150804153449
Implantable Pulse Generator Model: 2210
Implantable Pulse Generator Serial Number: 7209799
Lead Channel Impedance Value: 512.5 Ohm
Lead Channel Pacing Threshold Pulse Width: 0.4 ms
Lead Channel Pacing Threshold Pulse Width: 0.4 ms
Lead Channel Sensing Intrinsic Amplitude: 10.6 mV
Lead Channel Sensing Intrinsic Amplitude: 2 mV
Lead Channel Setting Pacing Amplitude: 1 V
Lead Channel Setting Pacing Pulse Width: 0.4 ms
MDC IDC MSMT BATTERY REMAINING LONGEVITY: 129.6 mo
MDC IDC MSMT LEADCHNL RA PACING THRESHOLD AMPLITUDE: 0.75 V
MDC IDC MSMT LEADCHNL RV PACING THRESHOLD AMPLITUDE: 0.75 V
MDC IDC SET LEADCHNL RV SENSING SENSITIVITY: 2.5 mV

## 2013-09-14 LAB — POCT INR: INR: 5.3

## 2013-09-14 NOTE — Progress Notes (Signed)
Patient Care Team: Jefm Petty, MD as PCP - General (Family Medicine) Deboraha Sprang, MD as Consulting Physician (Cardiology)   HPI  [name] is a 80 y.o. female With chronic shortness of breath who underwent AV junction ablation for atrial fibrillation with a rapid rate that was uncontrollable. This did not have a significant impact on exercise tolerance.  She continues to struggle with fatigue.  She also has tremor which is quite disabling  Myoview 2014 demonstrated low risk scan with no scar or ischemia  Interval echo demonstrated normal left ventricular function and only mild postsurgical MR    Past Medical History  Diagnosis Date  . Complete AV block s/p AV ablation   . ACID REFLUX DISEASE   . ANEMIA-NOS   . Atrial fibrillation   . BREAST CANCER   . Chronic diastolic heart failure     EF normal 2014 Echo; myoview Neg  . COPD   . DEPRESSION/ANXIETY   . DIVERTICULITIS, HX OF   . DYSGEUSIA   . Essential hypertension, benign   . FATIGUE   . GLAUCOMA   . Shortness of breath   . LYMPHEDEMA, RIGHT ARM   . Tremor   . Gait disturbance   . Memory loss   . Pacemaker-St.Jude 06/19/2010  . Ventricular tachycardia-nonsustained 03/15/2013    Past Surgical History  Procedure Laterality Date  . Mitral valve repair  2002  . Esophagogastrodenoscopy    . Partial colectomy    . Shoulder surgery    . Mastectomy      right breast   . Cardioversion  01/21/2011    Procedure: CARDIOVERSION;  Surgeon: Deboraha Sprang, MD;  Location: [place];  Service: Cardiovascular;  Laterality: N/A;  OUT PATIENT CARDIOVERSION  . Cardioversion  05/16/2011    Procedure: CARDIOVERSION;  Surgeon: Deboraha Sprang, MD;  Location: [place];  Service: Cardiovascular;  Laterality: N/A;  . Pacemaker insertion  2012  . Cardioversion  01/02/2012    Procedure: CARDIOVERSION;  Surgeon: Renella Cunas, MD;  Location: [facility] ENDOSCOPY;  Service: Cardiovascular;  Laterality: N/A;  Dr. Caryl Comes will be coming over to do  Cardioversion    Current Outpatient Prescriptions  Medication Sig Dispense Refill  . B Complex Vitamins (VITAMIN B COMPLEX PO) Take by mouth daily.      . brimonidine (ALPHAGAN) 0.15 % ophthalmic solution Place 1 drop into both eyes 2 (two) times daily.       . clonazePAM (KLONOPIN) 0.5 MG tablet Take 1 tablet (0.5 mg total) by mouth 2 (two) times daily as needed for anxiety.  60 tablet  5  . Docusate Sodium (DULCOLAX STOOL SOFTENER PO) Take 1 capsule by mouth daily.       . mometasone (NASONEX) 50 MCG/ACT nasal spray Place 2 sprays into the nose daily.      . sertraline (ZOLOFT) 50 MG tablet Take 1 tablet (50 mg total) by mouth daily.  30 tablet  12  . warfarin (COUMADIN) 5 MG tablet Take as directed by coumadin clinic  40 tablet  3  . zolpidem (AMBIEN) 10 MG tablet Take 1 tablet (10 mg total) by mouth at bedtime as needed for sleep.  30 tablet  5   No current facility-administered medications for this visit.    Allergies  Allergen Reactions  . Amiodarone   . Citalopram Hydrobromide Other (See Comments)    Causes depression  . Codeine     REACTION: Nausea and cold sweats  . Cymbalta [Duloxetine Hcl]  Other (See Comments)    Nausea nervousness  . Diltiazem Other (See Comments)    Doesn't remember  . Multaq [Dronedarone Hydrochloride] Other (See Comments)    unknown  . Pristiq [Desvenlafaxine Succinate Monohydrate] Other (See Comments)    palpitations  . Propafenone Hcl     REACTION: Reaction not known  . Tramadol Other (See Comments)    unknown    Review of Systems negative except from HPI and PMH  Physical Exam BP 137/88  Pulse 87  Ht 5\' 2"  (1.575 m) Well developed and well nourished in no acute distress HENT normal E scleral and icterus clear Neck Supple JVP flat;  Clear to ausculation Device pocket well healed; without hematoma or erythema.  There is no tethering regular rate and rhythm, no murmurs gallop s or rub Soft   No clubbing cyanosis no  Edema Alert and  oriented, grossly normal motor and sensory function Skin Warm and Dry    Assessment and  Plan  Atrial fibrillation   complete heart block  Pacemaker-St. Jude  The patient's device was interrogated.  The information was reviewed. No changes were made in the programming.    PVCs  Chronic diastolic heart failure   She is having frequent PVCs. However, given her tremor, I'm not sure that antiarrhythmic therapy would be particularly useful for this. She could potentially tolerate a beta blocker but she is struggling somewhat with medications and with depression I would worry about trying this.  If her neurologist would like to do that her tremor I will defer to him/her.  Otherwise from cardiac point of view she is quite stable.  She is euvolemic without evidence of volume overload

## 2013-09-14 NOTE — Patient Instructions (Addendum)
Your physician recommends that you continue on your current medications as directed. Please refer to the Current Medication list given to you today.  Remote monitoring is used to monitor your Pacemaker of ICD from home. This monitoring reduces the number of office visits required to check your device to one time per year. It allows us to keep an eye on the functioning of your device to ensure it is working properly. You are scheduled for a device check from home on 12/16/13. You may send your transmission at any time that day. If you have a wireless device, the transmission will be sent automatically. After your physician reviews your transmission, you will receive a postcard with your next transmission date.  Your physician wants you to follow-up in: 1 year with Dr. Klein.  You will receive a reminder letter in the mail two months in advance. If you don't receive a letter, please call our office to schedule the follow-up appointment.    

## 2013-09-20 ENCOUNTER — Encounter: Payer: Self-pay | Admitting: Internal Medicine

## 2013-09-23 ENCOUNTER — Ambulatory Visit (INDEPENDENT_AMBULATORY_CARE_PROVIDER_SITE_OTHER): Payer: Medicare Other | Admitting: *Deleted

## 2013-09-23 DIAGNOSIS — Z5181 Encounter for therapeutic drug level monitoring: Secondary | ICD-10-CM

## 2013-09-23 DIAGNOSIS — Z7901 Long term (current) use of anticoagulants: Secondary | ICD-10-CM

## 2013-09-23 DIAGNOSIS — I4891 Unspecified atrial fibrillation: Secondary | ICD-10-CM

## 2013-09-23 LAB — POCT INR: INR: 1.4

## 2013-09-30 ENCOUNTER — Ambulatory Visit (INDEPENDENT_AMBULATORY_CARE_PROVIDER_SITE_OTHER): Payer: Medicare Other | Admitting: Pharmacist Clinician (PhC)/ Clinical Pharmacy Specialist

## 2013-09-30 DIAGNOSIS — Z7901 Long term (current) use of anticoagulants: Secondary | ICD-10-CM

## 2013-09-30 DIAGNOSIS — I4891 Unspecified atrial fibrillation: Secondary | ICD-10-CM

## 2013-09-30 DIAGNOSIS — Z5181 Encounter for therapeutic drug level monitoring: Secondary | ICD-10-CM

## 2013-09-30 LAB — POCT INR: INR: 1.7

## 2013-10-07 ENCOUNTER — Ambulatory Visit (INDEPENDENT_AMBULATORY_CARE_PROVIDER_SITE_OTHER): Payer: Medicare Other | Admitting: *Deleted

## 2013-10-07 DIAGNOSIS — I4891 Unspecified atrial fibrillation: Secondary | ICD-10-CM

## 2013-10-07 DIAGNOSIS — Z5181 Encounter for therapeutic drug level monitoring: Secondary | ICD-10-CM

## 2013-10-07 DIAGNOSIS — Z7901 Long term (current) use of anticoagulants: Secondary | ICD-10-CM

## 2013-10-07 LAB — POCT INR: INR: 1.7

## 2013-10-13 ENCOUNTER — Telehealth: Payer: Self-pay | Admitting: *Deleted

## 2013-10-13 NOTE — Telephone Encounter (Signed)
[name], Dr. Royal Piedra office calling (pcp) re: pt and her request for clonazepam.  I relayed that per Dr. Rhea Belton note that yes refills per pcp.  See pt in one year.   She verbalized understanding.

## 2013-10-19 ENCOUNTER — Ambulatory Visit (INDEPENDENT_AMBULATORY_CARE_PROVIDER_SITE_OTHER): Payer: Medicare Other | Admitting: *Deleted

## 2013-10-19 DIAGNOSIS — Z5181 Encounter for therapeutic drug level monitoring: Secondary | ICD-10-CM

## 2013-10-19 DIAGNOSIS — Z7901 Long term (current) use of anticoagulants: Secondary | ICD-10-CM

## 2013-10-19 DIAGNOSIS — I4891 Unspecified atrial fibrillation: Secondary | ICD-10-CM

## 2013-10-19 LAB — POCT INR: INR: 3

## 2013-11-02 ENCOUNTER — Ambulatory Visit (INDEPENDENT_AMBULATORY_CARE_PROVIDER_SITE_OTHER): Payer: Medicare Other | Admitting: *Deleted

## 2013-11-02 DIAGNOSIS — Z5181 Encounter for therapeutic drug level monitoring: Secondary | ICD-10-CM

## 2013-11-02 DIAGNOSIS — I4891 Unspecified atrial fibrillation: Secondary | ICD-10-CM

## 2013-11-02 DIAGNOSIS — Z7901 Long term (current) use of anticoagulants: Secondary | ICD-10-CM

## 2013-11-02 LAB — POCT INR: INR: 2.1

## 2013-11-16 ENCOUNTER — Ambulatory Visit (INDEPENDENT_AMBULATORY_CARE_PROVIDER_SITE_OTHER): Payer: Medicare Other | Admitting: *Deleted

## 2013-11-16 DIAGNOSIS — I4891 Unspecified atrial fibrillation: Secondary | ICD-10-CM

## 2013-11-16 DIAGNOSIS — Z5181 Encounter for therapeutic drug level monitoring: Secondary | ICD-10-CM

## 2013-11-16 DIAGNOSIS — Z7901 Long term (current) use of anticoagulants: Secondary | ICD-10-CM

## 2013-11-16 LAB — POCT INR: INR: 2.6

## 2013-11-23 ENCOUNTER — Telehealth: Payer: Self-pay | Admitting: Neurology

## 2013-11-23 NOTE — Telephone Encounter (Signed)
Patient's son Roselyn Reef calling to state that patient may be having a bad reaction to medication that Dr. Krista Blue prescribed, states that patient is having nighttime semi psychosis and extreme confusion, please return call and advise.

## 2013-11-23 NOTE — Telephone Encounter (Signed)
Called patient's son back he called wrong office and he will call her PCP. Stated to him if he needed [place] please give [place] a call back.

## 2013-11-24 ENCOUNTER — Telehealth: Payer: Self-pay | Admitting: Neurology

## 2013-11-24 NOTE — Telephone Encounter (Signed)
Spoke with patient and scheduled her for Friday 11/26/13 at 8 am with Dr Krista Blue.

## 2013-11-24 NOTE — Telephone Encounter (Signed)
Patient's son Roselyn Reef calling to request an appointment to see Dr. Krista Blue since patient was given new medication and her tremors have increased and son describes them as "violent." please return call and advise.

## 2013-11-26 ENCOUNTER — Ambulatory Visit: Payer: Self-pay | Admitting: Neurology

## 2013-12-02 ENCOUNTER — Other Ambulatory Visit: Payer: Self-pay

## 2013-12-02 MED ORDER — CLONAZEPAM 0.5 MG PO TABS: 0.5000 mg | ORAL_TABLET | Freq: Two times a day (BID) | ORAL | Status: DC | PRN

## 2013-12-02 NOTE — Telephone Encounter (Signed)
Patient has rescheduled missed appt.  

## 2013-12-07 ENCOUNTER — Ambulatory Visit (INDEPENDENT_AMBULATORY_CARE_PROVIDER_SITE_OTHER): Payer: Medicare Other | Admitting: Pharmacist Clinician (PhC)/ Clinical Pharmacy Specialist

## 2013-12-07 DIAGNOSIS — I4891 Unspecified atrial fibrillation: Secondary | ICD-10-CM

## 2013-12-07 DIAGNOSIS — Z5181 Encounter for therapeutic drug level monitoring: Secondary | ICD-10-CM

## 2013-12-07 DIAGNOSIS — Z7901 Long term (current) use of anticoagulants: Secondary | ICD-10-CM

## 2013-12-07 LAB — POCT INR: INR: 2

## 2013-12-16 ENCOUNTER — Ambulatory Visit (INDEPENDENT_AMBULATORY_CARE_PROVIDER_SITE_OTHER): Payer: Medicare Other | Admitting: *Deleted

## 2013-12-16 DIAGNOSIS — I472 Ventricular tachycardia, unspecified: Secondary | ICD-10-CM

## 2013-12-16 DIAGNOSIS — I4891 Unspecified atrial fibrillation: Secondary | ICD-10-CM

## 2013-12-16 LAB — MDC_IDC_ENUM_SESS_TYPE_REMOTE
Battery Remaining Longevity: 112 mo
Battery Voltage: 2.96 V
Brady Statistic RV Percent Paced: 84 %
Implantable Pulse Generator Model: 2210
Implantable Pulse Generator Serial Number: 7209799
Lead Channel Sensing Intrinsic Amplitude: 5.4 mV
Lead Channel Setting Pacing Pulse Width: 0.4 ms
MDC IDC MSMT BATTERY REMAINING PERCENTAGE: 82 %
MDC IDC MSMT LEADCHNL RV IMPEDANCE VALUE: 490 Ohm
MDC IDC MSMT LEADCHNL RV PACING THRESHOLD AMPLITUDE: 0.75 V
MDC IDC MSMT LEADCHNL RV PACING THRESHOLD PULSEWIDTH: 0.4 ms
MDC IDC SESS DTM: 20151105075729
MDC IDC SET LEADCHNL RV PACING AMPLITUDE: 1 V
MDC IDC SET LEADCHNL RV SENSING SENSITIVITY: 2.5 mV

## 2013-12-16 NOTE — Progress Notes (Signed)
Remote pacemaker transmission.   

## 2013-12-20 ENCOUNTER — Encounter: Payer: Self-pay | Admitting: Neurology

## 2013-12-20 ENCOUNTER — Ambulatory Visit (INDEPENDENT_AMBULATORY_CARE_PROVIDER_SITE_OTHER): Payer: Medicare Other | Admitting: Neurology

## 2013-12-20 VITALS — BP 112/76

## 2013-12-20 DIAGNOSIS — R251 Tremor, unspecified: Secondary | ICD-10-CM

## 2013-12-20 DIAGNOSIS — R269 Unspecified abnormalities of gait and mobility: Secondary | ICD-10-CM

## 2013-12-20 DIAGNOSIS — I4891 Unspecified atrial fibrillation: Secondary | ICD-10-CM

## 2013-12-20 DIAGNOSIS — I442 Atrioventricular block, complete: Secondary | ICD-10-CM

## 2013-12-20 DIAGNOSIS — I10 Essential (primary) hypertension: Secondary | ICD-10-CM

## 2013-12-20 MED ORDER — CLONAZEPAM 0.5 MG PO TABS: ORAL_TABLET | ORAL | Status: DC

## 2013-12-20 NOTE — Progress Notes (Signed)
[facility]  PATIENT: [name] DOB: 02-May-1923  HISTORICAL  Initial visit:  [name] is a 80 years old right-handed Caucasian female, referred by her cardiologist is Dr. Caryl Comes for evaluation of bilateral hands tremor, she is accompanied by her daughter at today's clinical visit  She has past medical history of pacemaker placement, atrial fibrillation, on chronic Coumadin treatment, pacemaker placement, history of right breast cancer, status post lobectomy, in 1990s, gait disorder, hypertension, she denies family history of tremor,  She still lives alone in her apartment, ambulate with a cane, over past 1 year, since 2013, she noticed bilateral hands tremor, especially when holding a cup of coffee, using her utensils, or writing, she also complains of bilateral lower extremity shaky when bearing weight.   She denied lost sense of smell, denies significant memory trouble, she sleeps well, had vivid dreams,  She is taking clonazepam 1 mg every morning, daughter noticed that she was drowsy, legs were heavy after taking medications  UPDATE March 19th 2015: She was noted to have hands shaking,  Her gait continues to get worse, left muscle fasiculation. "The shaking has taken up my life",   Gabapentin 300mg  qid was provided by her psychologist, without help,   She has run out of her Ambien, clonazepam, Zoloft for few weeks, for the reason not sure, she also complains of inside jitterish, worsening anxiety  UPDATE Nov 9th 2015: She still lives alone, no longer driving since 1610, she has sitter coming regularly, complains of worsening bilateral hands tremor, frustrated that she has to use 2 hands to hold coffee, she still takes Klonopin 0.5 mg twice a day, which does help her tremor, she also complains of excessive fatigue,Lack of stamina, shortness of breath with minimum exertion, she carried a diagnosis of COPD,  Most recent echocardiogram February 2014 showed  ejection fraction 50-60%, X-ray of chest April 2015 showed no acute pulmonary process  REVIEW OF SYSTEMS: Full 14 system review of systems performed and notable only for  Fatigue, eye itching, double vision, shortness of breath, hearing loss, runny nose, constipation, walking difficulties, tremor, depression  ALLERGIES: Allergies  Allergen Reactions  . Amiodarone   . Citalopram Hydrobromide Other (See Comments)    Causes depression  . Codeine     REACTION: Nausea and cold sweats  . Cymbalta [Duloxetine Hcl] Other (See Comments)    Nausea nervousness  . Diltiazem Other (See Comments)    Doesn't remember  . Multaq [Dronedarone Hydrochloride] Other (See Comments)    unknown  . Pristiq [Desvenlafaxine Succinate Monohydrate] Other (See Comments)    palpitations  . Propafenone Hcl     REACTION: Reaction not known  . Tramadol Other (See Comments)    unknown    HOME MEDICATIONS: Outpatient Prescriptions Prior to Visit  Medication Sig Dispense Refill  . B Complex Vitamins (VITAMIN B COMPLEX PO) Take by mouth daily.    . brimonidine (ALPHAGAN) 0.15 % ophthalmic solution Place 1 drop into both eyes 2 (two) times daily.     . clonazePAM (KLONOPIN) 0.5 MG tablet Take 1 tablet (0.5 mg total) by mouth 2 (two) times daily as needed. 60 tablet 2  . Docusate Sodium (DULCOLAX STOOL SOFTENER PO) Take 1 capsule by mouth daily.     . mometasone (NASONEX) 50 MCG/ACT nasal spray Place 2 sprays into the nose daily.    . sertraline (ZOLOFT) 50 MG tablet Take 1 tablet (50 mg total) by mouth daily. 30 tablet 12  . warfarin (COUMADIN)  5 MG tablet Take as directed by coumadin clinic 40 tablet 3  . zolpidem (AMBIEN) 10 MG tablet Take 1 tablet (10 mg total) by mouth at bedtime as needed for sleep. 30 tablet 5   No facility-administered medications prior to visit.    PAST MEDICAL HISTORY: Past Medical History  Diagnosis Date  . Complete AV block s/p AV ablation   . ACID REFLUX DISEASE   . ANEMIA-NOS     . Atrial fibrillation   . BREAST CANCER, in 1990s, right   . Chronic diastolic heart failure   . COPD   . DEPRESSION/ANXIETY   . DIVERTICULITIS, HX OF   . DYSGEUSIA   . Essential hypertension, benign   . FATIGUE   . GLAUCOMA   . Shortness of breath   . Pacemaker Medtronic   . LYMPHEDEMA, RIGHT ARM   . Tremor   . Gait disturbance   . Memory loss     PAST SURGICAL HISTORY: Past Surgical History  Procedure Laterality Date  . Mitral valve repair  2002  . Esophagogastrodenoscopy    . Partial colectomy    . Shoulder surgery    . Mastectomy      right breast   . Cardioversion  01/21/2011    Procedure: CARDIOVERSION;  Surgeon: Deboraha Sprang, MD;  Location: [place];  Service: Cardiovascular;  Laterality: N/A;  OUT PATIENT CARDIOVERSION  . Cardioversion  05/16/2011    Procedure: CARDIOVERSION;  Surgeon: Deboraha Sprang, MD;  Location: [place];  Service: Cardiovascular;  Laterality: N/A;  . Pacemaker insertion  2012  . Cardioversion  01/02/2012    Procedure: CARDIOVERSION;  Surgeon: Renella Cunas, MD;  Location: [facility] ENDOSCOPY;  Service: Cardiovascular;  Laterality: N/A;  Dr. Caryl Comes will be coming over to do Cardioversion    FAMILY HISTORY: Family History  Problem Relation Age of Onset  . Other Mother 74    Died from old age  . Other Father 46    car accident  . Uterine cancer Sister 80  . Asthma Son 60    SOCIAL HISTORY:  History   Social History  . Marital Status: Widowed    Spouse Name: N/A    Number of Children: 10  . Years of Education: college   Occupational History    Retired   Social History Main Topics  . Smoking status: Never Smoker   . Smokeless tobacco: Never Used  . Alcohol Use: 0.6 oz/week    1 Glasses of wine per week     Comment: glass of wine everyday  . Drug Use: No  . Sexual Activity: Not on file    Social History Narrative   Patient is widowed and she is retired.  Patient lives at home alone. Patient has two years of college education.   Right  handed.   Caffeine- one cup of coffee daily.     PHYSICAL EXAM   Filed Vitals:    Not recorded      Cannot calculate BMI with a height equal to zero.   Generalized: In no acute distress  Neck: Supple, no carotid bruits   Cardiac: Regular rate rhythm  Pulmonary: Clear to auscultation bilaterally  Musculoskeletal: No deformity  Neurological examination  Mentation: Alert oriented to time, place, history taking, and causual conversation, anxious-looking elderly female,   Cranial nerve II-XII: Pupils were equal round reactive to light extraocular movements were full, visual field were full on confrontational test. facial sensation and strength were normal. hearing was intact to  finger rubbing bilaterally. Uvula tongue midline.  head turning and shoulder shrug and were normal and symmetric.Tongue protrusion into cheek strength was normal.  Motor: normal tone, bulk and strength she has mild bilateral hand posture tremor, there was no rigidity.  Sensory: Intact to fine touch, pinprick, preserved vibratory sensation, and proprioception at toes.  Coordination: Normal finger to nose, heel-to-shin bilaterally there was no truncal ataxia  Gait: Need to push up from seated position, cautious, unsteady gait,stiff  Romberg signs: Negative  Deep tendon reflexes: Brachioradialis 2/2, biceps 2/2, triceps 2/2, patellar 3/3, Achilles trace, plantar responses were flexor bilaterally.   DIAGNOSTIC DATA (LABS, IMAGING, TESTING) - I reviewed patient records, labs, notes, testing and imaging myself where available.  Lab Results  Component Value Date   WBC 6.5 02/25/2012   HGB 15.9* 02/25/2012   HCT 46.0 02/25/2012   MCV 98.7 02/25/2012   PLT 184.0 02/25/2012      Component Value Date/Time   NA 138 03/15/2013 1507   K 4.4 03/15/2013 1507   CL 106 03/15/2013 1507   CO2 23 03/15/2013 1507   GLUCOSE 90 03/15/2013 1507   BUN 15 03/15/2013 1507   CREATININE 0.8 03/15/2013 1507    CALCIUM 9.5 03/15/2013 1507   PROT 7.3 09/25/2011 1456   ALBUMIN 3.9 09/25/2011 1456   AST 26 09/25/2011 1456   ALT 18 09/25/2011 1456   ALKPHOS 85 09/25/2011 1456   BILITOT 0.4 09/25/2011 1456   GFRNONAA 79* 03/02/2012 0640   GFRAA >90 03/02/2012 0640   Lab Results  Component Value Date   CHOL 226* 04/15/2012   HDL 63.90 04/15/2012   LDLDIRECT 150.4 04/15/2012   TRIG 113.0 04/15/2012   CHOLHDL 4 04/15/2012   Lab Results  Component Value Date   HGBA1C 5.7 04/29/2012   Lab Results  Component Value Date   VITAMINB12 698 04/29/2012   Lab Results  Component Value Date   TSH 1.11 12/31/2011      ASSESSMENT AND PLAN   80 years old right-handed Caucasian female, with bilateral hands tremor, bilateral lower extremity tremor, no rigidity, no weakness,now she complains of worsening gait difficulty, shortness of breath with minimum exertion, with diagnosis of COPD, also has a history of chronic atrial fibrillation status post pacemaker,on chronic Coumadin.  1,. Her bilateral hands tremor almost consistent with essential tremor, repeat TSH, continue low-dose clonazepam, 0.5 mg half to one tablets twice a day 2, her gait difficulty likely a combination of aging, deconditioning, shortness of breath with exertion, she also has evidence of bilateral intrinsic hands muscle atrophy, hyperreflexia of bilateral upper extremity, suggestive of cervical spondylitic myelopathy, 3, CAT scan of cervical spine, 4, home physical therapy 5, return to clinic in 2 months.     Marcial Pacas, M.D. Ph.D.  [facility] [address], [place], [place] [address] [phone]

## 2013-12-20 NOTE — Addendum Note (Signed)
Addended by: Marcial Pacas on: 12/20/2013 02:19 PM   Modules accepted: Orders

## 2013-12-30 ENCOUNTER — Ambulatory Visit
Admission: RE | Admit: 2013-12-30 | Discharge: 2013-12-30 | Disposition: A | Discharge status: Home / Self Care | Payer: Medicare Other | Source: Ambulatory Visit | Attending: Neurology | Admitting: Neurology

## 2013-12-30 DIAGNOSIS — R251 Tremor, unspecified: Secondary | ICD-10-CM

## 2013-12-30 DIAGNOSIS — I4891 Unspecified atrial fibrillation: Secondary | ICD-10-CM

## 2013-12-30 DIAGNOSIS — R269 Unspecified abnormalities of gait and mobility: Secondary | ICD-10-CM

## 2013-12-30 DIAGNOSIS — I10 Essential (primary) hypertension: Secondary | ICD-10-CM

## 2013-12-30 DIAGNOSIS — I442 Atrioventricular block, complete: Secondary | ICD-10-CM

## 2013-12-31 ENCOUNTER — Telehealth: Payer: Self-pay | Admitting: Neurology

## 2013-12-31 NOTE — Telephone Encounter (Signed)
I have called [facility], CT cervical showed multilevel degenerative disc disease, foraminal stenosis, which will explain some of her hand weakness, gait difficulty,   I have advised her continue physical therapy, Keep follow-up appointment in January 2016

## 2014-01-04 ENCOUNTER — Ambulatory Visit (INDEPENDENT_AMBULATORY_CARE_PROVIDER_SITE_OTHER): Payer: Medicare Other | Admitting: *Deleted

## 2014-01-04 DIAGNOSIS — Z7901 Long term (current) use of anticoagulants: Secondary | ICD-10-CM

## 2014-01-04 DIAGNOSIS — Z5181 Encounter for therapeutic drug level monitoring: Secondary | ICD-10-CM

## 2014-01-04 DIAGNOSIS — I4891 Unspecified atrial fibrillation: Secondary | ICD-10-CM

## 2014-01-04 LAB — POCT INR: INR: 2.4

## 2014-01-12 ENCOUNTER — Encounter: Payer: Self-pay | Admitting: Cardiology

## 2014-01-17 ENCOUNTER — Encounter: Payer: Self-pay | Admitting: Internal Medicine

## 2014-01-20 ENCOUNTER — Encounter (HOSPITAL_COMMUNITY): Payer: Self-pay | Admitting: Internal Medicine

## 2014-01-31 ENCOUNTER — Ambulatory Visit: Payer: Medicare Other | Admitting: Nurse Practitioner

## 2014-02-01 ENCOUNTER — Ambulatory Visit (INDEPENDENT_AMBULATORY_CARE_PROVIDER_SITE_OTHER): Payer: Medicare Other | Admitting: Pharmacist

## 2014-02-01 DIAGNOSIS — Z5181 Encounter for therapeutic drug level monitoring: Secondary | ICD-10-CM

## 2014-02-01 DIAGNOSIS — I4891 Unspecified atrial fibrillation: Secondary | ICD-10-CM

## 2014-02-01 DIAGNOSIS — Z7901 Long term (current) use of anticoagulants: Secondary | ICD-10-CM

## 2014-02-01 LAB — POCT INR: INR: 2.1

## 2014-02-18 ENCOUNTER — Ambulatory Visit (INDEPENDENT_AMBULATORY_CARE_PROVIDER_SITE_OTHER): Payer: 59 | Admitting: Neurology

## 2014-02-18 ENCOUNTER — Encounter: Payer: Self-pay | Admitting: Neurology

## 2014-02-18 VITALS — BP 112/76 | HR 65

## 2014-02-18 DIAGNOSIS — Z950 Presence of cardiac pacemaker: Secondary | ICD-10-CM

## 2014-02-18 DIAGNOSIS — R269 Unspecified abnormalities of gait and mobility: Secondary | ICD-10-CM

## 2014-02-18 DIAGNOSIS — R413 Other amnesia: Secondary | ICD-10-CM

## 2014-02-18 DIAGNOSIS — R251 Tremor, unspecified: Secondary | ICD-10-CM

## 2014-02-18 MED ORDER — MEMANTINE HCL 10 MG PO TABS: 10.0000 mg | ORAL_TABLET | Freq: Two times a day (BID) | ORAL | Status: AC

## 2014-02-18 NOTE — Progress Notes (Signed)
[facility]  PATIENT: [name] DOB: May 17, 1923  HISTORICAL  Initial visit 2014:  [name] is a 81 years old right-handed Caucasian female, referred by her cardiologist is Dr. Caryl Comes for evaluation of bilateral hands tremor, she is accompanied by her daughter at today's clinical visit  She has past medical history of pacemaker placement, atrial fibrillation, on chronic Coumadin treatment, pacemaker placement, history of right breast cancer, status post lobectomy, in 1990s, gait disorder, hypertension, she denies family history of tremor,  She still lives alone in her apartment, ambulate with a cane, over past 1 year, since 2013, she noticed bilateral hands tremor, especially when holding a cup of coffee, using her utensils, or writing, she also complains of bilateral lower extremity shaky when bearing weight.   She denied lost sense of smell, denies significant memory trouble, she sleeps well, had vivid dreams,  She is taking clonazepam 1 mg every morning, daughter noticed that she was drowsy, legs were heavy after taking medications  UPDATE March 19th 2015: She was noted to have hands shaking,  Her gait continues to get worse, left muscle fasiculation. "The shaking has taken up my life",   Gabapentin 300mg  qid by her psychologist, without help,   She has run out of her Ambien, clonazepam, Zoloft for few weeks, for the reason not sure, she also complains of inside jitterish, worsening anxiety  UPDATE Nov 9th 2015: She still lives alone, no longer driving since 9169, she has sitter coming regularly, complains of worsening bilateral hands tremor, frustrated that she has to use 2 hands to hold coffee, she still takes Klonopin 0.5 mg twice a day, which does help her tremor, she also complains of excessive fatigue,Lack of stamina, shortness of breath with minimum exertion, she carried a diagnosis of COPD,  Most recent echocardiogram February 2014 showed ejection  fraction 50-60%, X-ray of chest April 2015 showed no acute pulmonary process  Her bilateral hands tremor almost consistent with essential tremor, repeat TSH, continue low-dose clonazepam, 0.5 mg half to one tablets twice a day Her gait difficulty likely a combination of aging, deconditioning, shortness of breath with exertion, she also has evidence of bilateral intrinsic hands muscle atrophy, hyperreflexia of bilateral upper extremity, suggestive of cervical spondylitic myelopathy,  UPDATE Jan 8th 2016: She still has a lot of hands tremor, clonazepam 0.5 milligrams twice a day he has been helpful, occasionally visual hallucinations, more forgetful, We have reviewed CAT scan of cervical spine, multilevel degenerative disc disease, moderate foraminal stenosis at different levels, mild-to-moderate canal stenosis   REVIEW OF SYSTEMS: Full 14 system review of systems performed and notable only for  double vision, cough, shortness of breath, constipation, walking difficulty, tremor, depression  ALLERGIES: Allergies  Allergen Reactions  . Amiodarone   . Citalopram Hydrobromide Other (See Comments)    Causes depression  . Codeine     REACTION: Nausea and cold sweats  . Cymbalta [Duloxetine Hcl] Other (See Comments)    Nausea nervousness  . Diltiazem Other (See Comments)    Doesn't remember  . Multaq [Dronedarone Hydrochloride] Other (See Comments)    unknown  . Pristiq [Desvenlafaxine Succinate Monohydrate] Other (See Comments)    palpitations  . Propafenone Hcl     REACTION: Reaction not known  . Tramadol Other (See Comments)    unknown    HOME MEDICATIONS: Outpatient Prescriptions Prior to Visit  Medication Sig Dispense Refill  . B Complex Vitamins (VITAMIN B COMPLEX PO) Take by mouth daily.    Marland Kitchen  brimonidine (ALPHAGAN) 0.15 % ophthalmic solution Place 1 drop into both eyes 2 (two) times daily.     . clonazePAM (KLONOPIN) 0.5 MG tablet 1/2 to one po bid 60 tablet 5  . Docusate  Sodium (DULCOLAX STOOL SOFTENER PO) Take 1 capsule by mouth daily.     . mometasone (NASONEX) 50 MCG/ACT nasal spray Place 2 sprays into the nose daily.    . sertraline (ZOLOFT) 50 MG tablet Take 1 tablet (50 mg total) by mouth daily. 30 tablet 12  . warfarin (COUMADIN) 5 MG tablet Take as directed by coumadin clinic 40 tablet 3   No facility-administered medications prior to visit.    PAST MEDICAL HISTORY: Past Medical History  Diagnosis Date  . Complete AV block s/p AV ablation   . ACID REFLUX DISEASE   . ANEMIA-NOS   . Atrial fibrillation   . BREAST CANCER, in 1990s, right   . Chronic diastolic heart failure   . COPD   . DEPRESSION/ANXIETY   . DIVERTICULITIS, HX OF   . DYSGEUSIA   . Essential hypertension, benign   . FATIGUE   . GLAUCOMA   . Shortness of breath   . Pacemaker Med[employer]onic   . LYMPHEDEMA, RIGHT ARM   . Tremor   . Gait disturbance   . Memory loss     PAST SURGICAL HISTORY: Past Surgical History  Procedure Laterality Date  . Mitral valve repair  2002  . Esophagogastrodenoscopy    . Partial colectomy    . Shoulder surgery    . Mastectomy      right breast   . Cardioversion  01/21/2011    Procedure: CARDIOVERSION;  Surgeon: Deboraha Sprang, MD;  Location: [facility];  Service: Cardiovascular;  Laterality: N/A;  OUT PATIENT CARDIOVERSION  . Cardioversion  05/16/2011    Procedure: CARDIOVERSION;  Surgeon: Deboraha Sprang, MD;  Location: [facility];  Service: Cardiovascular;  Laterality: N/A;  . Pacemaker insertion  2012  . Cardioversion  01/02/2012    Procedure: CARDIOVERSION;  Surgeon: Renella Cunas, MD;  Location: [facility] ENDOSCOPY;  Service: Cardiovascular;  Laterality: N/A;  Dr. Caryl Comes will be coming over to do Cardioversion    FAMILY HISTORY: Family History  Problem Relation Age of Onset  . Other Mother 61    Died from old age  . Other Father 69    car accident  . Uterine cancer Sister 7  . Asthma Son 24    SOCIAL HISTORY:  History   Social History  .  Marital Status: Widowed    Spouse Name: N/A    Number of Children: 10  . Years of Education: college   Occupational History    Retired   Social History Main Topics  . Smoking status: Never Smoker   . Smokeless tobacco: Never Used  . Alcohol Use: 0.6 oz/week    1 Glasses of wine per week     Comment: glass of wine everyday  . Drug Use: No  . Sexual Activity: Not on file    Social History Narrative   Patient is widowed and she is retired.  Patient lives at home alone. Patient has two years of college education.   Right handed.   Caffeine- one cup of coffee daily.     PHYSICAL EXAM   Filed Vitals:   02/18/14 1052  BP: 112/76  Pulse: 65    Not recorded      Cannot calculate BMI with a height equal to zero.   Generalized: In  no acute distress  Neck: Supple, no carotid bruits   Cardiac: Regular rate rhythm  Pulmonary: Clear to auscultation bilaterally  Musculoskeletal: No deformity  Neurological examination  Mentation: Alert oriented to time, place, history taking, and causual conversation, anxious-looking elderly female, MMSE 18/30, she has missed 2 out of 3 recalls, is not oriented to time and place, has difficulty coping design, or write a sentence  Cranial nerve II-XII: Pupils were equal round reactive to light extraocular movements were full, visual field were full on confrontational test. facial sensation and strength were normal. hearing was intact to finger rubbing bilaterally. Uvula tongue midline.  head turning and shoulder shrug and were normal and symmetric.Tongue protrusion into cheek strength was normal.  Motor: normal tone, bulk and strength she has mild bilateral hand posture tremor, there was no rigidity.  Sensory: Intact to fine touch, pinprick, preserved vibratory sensation, and proprioception at toes.  Coordination: Normal finger to nose, heel-to-shin bilaterally there was no truncal ataxia  Gait: Need to push up from seated position,  cautious, unsteady gait,stiff  Romberg signs: Negative  Deep tendon reflexes: Brachioradialis 2/2, biceps 2/2, triceps 2/2, patellar 3/3, Achilles trace, plantar responses were flexor bilaterally.   DIAGNOSTIC DATA (LABS, IMAGING, TESTING) - I reviewed patient records, labs, notes, testing and imaging myself where available.  Lab Results  Component Value Date   WBC 6.5 02/25/2012   HGB 15.9* 02/25/2012   HCT 46.0 02/25/2012   MCV 98.7 02/25/2012   PLT 184.0 02/25/2012      Component Value Date/Time   NA 138 03/15/2013 1507   K 4.4 03/15/2013 1507   CL 106 03/15/2013 1507   CO2 23 03/15/2013 1507   GLUCOSE 90 03/15/2013 1507   BUN 15 03/15/2013 1507   CREATININE 0.8 03/15/2013 1507   CALCIUM 9.5 03/15/2013 1507   PROT 7.3 09/25/2011 1456   ALBUMIN 3.9 09/25/2011 1456   AST 26 09/25/2011 1456   ALT 18 09/25/2011 1456   ALKPHOS 85 09/25/2011 1456   BILITOT 0.4 09/25/2011 1456   GFRNONAA 79* 03/02/2012 0640   GFRAA >90 03/02/2012 0640   Lab Results  Component Value Date   CHOL 226* 04/15/2012   HDL 63.90 04/15/2012   LDLDIRECT 150.4 04/15/2012   TRIG 113.0 04/15/2012   CHOLHDL 4 04/15/2012   Lab Results  Component Value Date   HGBA1C 5.7 04/29/2012   Lab Results  Component Value Date   VITAMINB12 698 04/29/2012   Lab Results  Component Value Date   TSH 1.11 12/31/2011      ASSESSMENT AND PLAN   81 years old right-handed Caucasian female, with bilateral hands tremor, bilateral lower extremity tremor, no rigidity, no weakness,now she complains of worsening gait difficulty, shortness of breath with minimum exertion, with diagnosis of COPD, also has a history of chronic atrial fibrillation status post pacemaker,on chronic Coumadin.  1,. Her bilateral hands tremor almost consistent with essential tremor, continue low-dose clonazepam, 0.5 mg half to one tablets twice a day 2, her gait difficulty likely a combination of aging, deconditioning, shortness of breath  with exertion, she also has evidence of bilateral intrinsic hands muscle atrophy, hyperreflexia of bilateral upper extremity, likely related to her cervical degenerative disease, 3. Dementia, add on Namenda 10 mg twice a day 4,, return to clinic in 4-6 months with Rhae Hammock M.D. Ph.D.  [facility] [address] [place], [place] [address] [phone]

## 2014-03-07 ENCOUNTER — Other Ambulatory Visit (HOSPITAL_COMMUNITY): Payer: Self-pay | Admitting: Cardiovascular Disease

## 2014-03-15 ENCOUNTER — Ambulatory Visit (INDEPENDENT_AMBULATORY_CARE_PROVIDER_SITE_OTHER): Payer: Medicare Other | Admitting: *Deleted

## 2014-03-15 DIAGNOSIS — I4891 Unspecified atrial fibrillation: Secondary | ICD-10-CM

## 2014-03-15 DIAGNOSIS — Z7901 Long term (current) use of anticoagulants: Secondary | ICD-10-CM

## 2014-03-15 DIAGNOSIS — Z5181 Encounter for therapeutic drug level monitoring: Secondary | ICD-10-CM

## 2014-03-15 LAB — POCT INR: INR: 1.9

## 2014-03-21 ENCOUNTER — Telehealth: Payer: Self-pay | Admitting: Cardiology

## 2014-03-21 ENCOUNTER — Encounter: Payer: Medicare Other | Admitting: *Deleted

## 2014-03-21 NOTE — Telephone Encounter (Signed)
LMOVM reminding pt to send remote transmission.   

## 2014-03-22 ENCOUNTER — Encounter: Payer: Self-pay | Admitting: Cardiology

## 2014-03-29 ENCOUNTER — Ambulatory Visit (INDEPENDENT_AMBULATORY_CARE_PROVIDER_SITE_OTHER): Payer: Medicare Other | Admitting: Pharmacist

## 2014-03-29 DIAGNOSIS — I4891 Unspecified atrial fibrillation: Secondary | ICD-10-CM

## 2014-03-29 DIAGNOSIS — Z7901 Long term (current) use of anticoagulants: Secondary | ICD-10-CM

## 2014-03-29 DIAGNOSIS — Z5181 Encounter for therapeutic drug level monitoring: Secondary | ICD-10-CM

## 2014-03-29 LAB — POCT INR: INR: 2.1

## 2014-04-26 ENCOUNTER — Ambulatory Visit (INDEPENDENT_AMBULATORY_CARE_PROVIDER_SITE_OTHER): Payer: Medicare Other | Admitting: *Deleted

## 2014-04-26 DIAGNOSIS — I4891 Unspecified atrial fibrillation: Secondary | ICD-10-CM

## 2014-04-26 DIAGNOSIS — Z5181 Encounter for therapeutic drug level monitoring: Secondary | ICD-10-CM

## 2014-04-26 DIAGNOSIS — Z7901 Long term (current) use of anticoagulants: Secondary | ICD-10-CM

## 2014-04-26 LAB — POCT INR: INR: 2.5

## 2014-05-12 ENCOUNTER — Other Ambulatory Visit: Payer: Self-pay

## 2014-05-12 ENCOUNTER — Encounter: Payer: Self-pay | Admitting: Internal Medicine

## 2014-05-12 MED ORDER — CLONAZEPAM 0.5 MG PO TABS: ORAL_TABLET | ORAL | Status: AC

## 2014-05-12 NOTE — Telephone Encounter (Signed)
Dr Krista Blue is out of the office.  Request forwarded to [facility] for review.

## 2014-05-12 NOTE — Telephone Encounter (Signed)
Rx signed and faxed.

## 2014-05-19 ENCOUNTER — Telehealth: Payer: Self-pay

## 2014-05-19 NOTE — Telephone Encounter (Signed)
[employer] sent a fax requesting a new Rx for Ambien 10mg  tabs one at bedtime as needed.  This drug is not currently on med list.  Last prescribed by our office a year ago on 04/29/2013.  Would you like to refill?  Please advise.  Thank you.

## 2014-05-19 NOTE — Telephone Encounter (Signed)
Jessica: I do not feel comfortable writing Ambien, she is already taking low dose clonazepam, she can  Delay her last dose of clonazepam,  Which can help her sleep

## 2014-05-19 NOTE — Telephone Encounter (Signed)
I called the patient on home, got no answer.  Left message.  Called cell, got no answer.  VM has not been set up.  Unable to leave message.  I called the pharmacy.  Spoke with Santiago Glad.  Relayed providers message.  She said they will note the file and also contact patient.

## 2014-05-31 ENCOUNTER — Ambulatory Visit (INDEPENDENT_AMBULATORY_CARE_PROVIDER_SITE_OTHER): Payer: Medicare Other | Admitting: *Deleted

## 2014-05-31 DIAGNOSIS — Z7901 Long term (current) use of anticoagulants: Secondary | ICD-10-CM | POA: Diagnosis not present

## 2014-05-31 DIAGNOSIS — I4891 Unspecified atrial fibrillation: Secondary | ICD-10-CM | POA: Diagnosis not present

## 2014-05-31 DIAGNOSIS — Z5181 Encounter for therapeutic drug level monitoring: Secondary | ICD-10-CM

## 2014-05-31 LAB — POCT INR: INR: 2.5

## 2014-07-01 ENCOUNTER — Ambulatory Visit (INDEPENDENT_AMBULATORY_CARE_PROVIDER_SITE_OTHER): Payer: Medicare Other

## 2014-07-01 DIAGNOSIS — I4891 Unspecified atrial fibrillation: Secondary | ICD-10-CM | POA: Diagnosis not present

## 2014-07-01 DIAGNOSIS — Z5181 Encounter for therapeutic drug level monitoring: Secondary | ICD-10-CM

## 2014-07-01 DIAGNOSIS — Z7901 Long term (current) use of anticoagulants: Secondary | ICD-10-CM

## 2014-07-01 LAB — POCT INR: INR: 4.1

## 2014-07-15 ENCOUNTER — Ambulatory Visit (INDEPENDENT_AMBULATORY_CARE_PROVIDER_SITE_OTHER): Payer: Medicare Other

## 2014-07-15 DIAGNOSIS — Z5181 Encounter for therapeutic drug level monitoring: Secondary | ICD-10-CM | POA: Diagnosis not present

## 2014-07-15 DIAGNOSIS — I4891 Unspecified atrial fibrillation: Secondary | ICD-10-CM | POA: Diagnosis not present

## 2014-07-15 DIAGNOSIS — Z7901 Long term (current) use of anticoagulants: Secondary | ICD-10-CM

## 2014-07-15 LAB — POCT INR: INR: 2.9

## 2014-08-05 ENCOUNTER — Ambulatory Visit (INDEPENDENT_AMBULATORY_CARE_PROVIDER_SITE_OTHER): Payer: Medicare Other | Admitting: *Deleted

## 2014-08-05 DIAGNOSIS — Z5181 Encounter for therapeutic drug level monitoring: Secondary | ICD-10-CM | POA: Diagnosis not present

## 2014-08-05 DIAGNOSIS — I4891 Unspecified atrial fibrillation: Secondary | ICD-10-CM

## 2014-08-05 DIAGNOSIS — Z7901 Long term (current) use of anticoagulants: Secondary | ICD-10-CM

## 2014-08-05 LAB — POCT INR: INR: 2.7

## 2014-08-19 ENCOUNTER — Ambulatory Visit: Payer: 59 | Admitting: Nurse Practitioner

## 2014-10-07 ENCOUNTER — Encounter: Payer: Self-pay | Admitting: *Deleted

## 2014-11-09 ENCOUNTER — Encounter: Payer: Self-pay | Admitting: *Deleted

## 2014-12-13 ENCOUNTER — Encounter: Payer: Self-pay | Admitting: *Deleted

## 2015-01-09 ENCOUNTER — Encounter: Payer: Self-pay | Admitting: *Deleted

## 2015-03-30 ENCOUNTER — Encounter: Payer: Medicare Other | Admitting: Internal Medicine

## 2015-05-01 ENCOUNTER — Encounter: Payer: Self-pay | Admitting: Internal Medicine

## 2015-05-01 ENCOUNTER — Ambulatory Visit (INDEPENDENT_AMBULATORY_CARE_PROVIDER_SITE_OTHER): Payer: Medicare Other | Admitting: Internal Medicine

## 2015-05-01 ENCOUNTER — Telehealth: Payer: Self-pay | Admitting: Internal Medicine

## 2015-05-01 VITALS — BP 150/87 | HR 80 | Ht 62.0 in | Wt 117.2 lb

## 2015-05-01 DIAGNOSIS — I442 Atrioventricular block, complete: Secondary | ICD-10-CM

## 2015-05-01 DIAGNOSIS — Z950 Presence of cardiac pacemaker: Secondary | ICD-10-CM | POA: Diagnosis not present

## 2015-05-01 DIAGNOSIS — I482 Chronic atrial fibrillation, unspecified: Secondary | ICD-10-CM

## 2015-05-01 NOTE — Telephone Encounter (Signed)
New Message  Pt son called states that he wasn't able to speak to the provider during  the office visit and would like a call back to discuss the details of the appt.Please call

## 2015-05-01 NOTE — Telephone Encounter (Signed)
Will forward to MD to review 

## 2015-05-01 NOTE — Progress Notes (Signed)
Patient Care Team: Jefm Petty, MD as PCP - General (Family Medicine) Deboraha Sprang, MD as Consulting Physician (Cardiology)   HPI  [name] is a 82 y.o. female With chronic shortness of breath who underwent AV junction ablation for atrial fibrillation with a rapid rate that was uncontrollable. This did not have a significant impact on exercise tolerance.  She continues to struggle with fatigue.  She also has tremor which is quite disabling  Myoview 2014 demonstrated low risk scan with no scar or ischemia  Interval echo demonstrated normal left ventricular function and only mild postsurgical MR    Past Medical History  Diagnosis Date  . Complete AV block s/p AV ablation   . ACID REFLUX DISEASE   . ANEMIA-NOS   . Atrial fibrillation ([place])   . BREAST CANCER   . Chronic diastolic heart failure ([place])     EF normal 2014 Echo; myoview Neg  . COPD   . DEPRESSION/ANXIETY   . DIVERTICULITIS, HX OF   . DYSGEUSIA   . Essential hypertension, benign   . FATIGUE   . GLAUCOMA   . Shortness of breath   . LYMPHEDEMA, RIGHT ARM   . Tremor   . Gait disturbance   . Memory loss   . Pacemaker-St.Jude 06/19/2010  . Ventricular tachycardia-nonsustained 03/15/2013    Past Surgical History  Procedure Laterality Date  . Mitral valve repair  2002  . Esophagogastrodenoscopy    . Partial colectomy    . Shoulder surgery    . Mastectomy      right breast   . Cardioversion  01/21/2011    Procedure: CARDIOVERSION;  Surgeon: Deboraha Sprang, MD;  Location: [place];  Service: Cardiovascular;  Laterality: N/A;  OUT PATIENT CARDIOVERSION  . Cardioversion  05/16/2011    Procedure: CARDIOVERSION;  Surgeon: Deboraha Sprang, MD;  Location: [facility];  Service: Cardiovascular;  Laterality: N/A;  . Pacemaker insertion  2012  . Cardioversion  01/02/2012    Procedure: CARDIOVERSION;  Surgeon: Renella Cunas, MD;  Location: [facility] ENDOSCOPY;  Service: Cardiovascular;  Laterality: N/A;  Dr. Caryl Comes will be coming  over to do Cardioversion  . Av node ablation N/A 03/02/2012    Procedure: AV NODE ABLATION;  Surgeon: Deboraha Sprang, MD;  Location: [facility] CATH LAB;  Service: Cardiovascular;  Laterality: N/A;    Current Outpatient Prescriptions  Medication Sig Dispense Refill  . B Complex Vitamins (VITAMIN B COMPLEX PO) Take by mouth daily.    . brimonidine (ALPHAGAN) 0.15 % ophthalmic solution Place 1 drop into both eyes 2 (two) times daily.     . clonazePAM (KLONOPIN) 0.5 MG tablet 1/2 to one po bid 60 tablet 3  . Docusate Sodium (DULCOLAX STOOL SOFTENER PO) Take 1 capsule by mouth daily.     . memantine (NAMENDA) 10 MG tablet Take 1 tablet (10 mg total) by mouth 2 (two) times daily. 60 tablet 11  . mometasone (NASONEX) 50 MCG/ACT nasal spray Place 2 sprays into the nose daily.    . sertraline (ZOLOFT) 50 MG tablet Take 1 tablet (50 mg total) by mouth daily. 30 tablet 12  . warfarin (COUMADIN) 5 MG tablet TAKE AS DIRECTED. 40 tablet 3   No current facility-administered medications for this visit.    Allergies  Allergen Reactions  . Amiodarone   . Citalopram Hydrobromide Other (See Comments)    Causes depression  . Codeine     REACTION: Nausea and cold sweats  . Cymbalta [Duloxetine  Hcl] Other (See Comments)    Nausea nervousness  . Diltiazem Other (See Comments)    Doesn't remember  . Multaq [Dronedarone Hydrochloride] Other (See Comments)    unknown  . Pristiq [Desvenlafaxine Succinate Monohydrate] Other (See Comments)    palpitations  . Propafenone Hcl     REACTION: Reaction not known  . Tramadol Other (See Comments)    unknown    Review of Systems negative except from HPI and PMH  Physical Exam BP 150/87 mmHg  Pulse 80  Ht 5\' 2"  (1.575 m)  Wt 117 lb 3.2 oz (53.162 kg)  BMI 21.43 kg/m2 Well developed and well nourished in no acute distress HENT normal E scleral and icterus clear Neck Supple JVP flat;  Clear to ausculation Device pocket well healed; without hematoma or erythema.   There is no tethering regular rate and rhythm, no murmurs gallop s or rub Soft   No clubbing cyanosis no  Edema Alert and oriented,  The year is 16 and the president is Obama sitting in a wheel chair with a bracelet on her ankle  Skin Warm and Dry  ECG demonstrates ventricular pacing with underlying atrial flutter  Assessment and  Plan  Atrial fibrillation   complete heart block  Ventricular tachycardia-nonsustained  Pacemaker-St. Jude  The patient's device was interrogated.  The information was reviewed. No changes were made in the programming.    PVCs  Chronic diastolic heart failure  Overall stable   Dementia is worse.  Atrial rhythm is permanent; on warfarin and tolerating

## 2015-05-01 NOTE — Patient Instructions (Addendum)
Medication Instructions: - Your physician recommends that you continue on your current medications as directed. Please refer to the Current Medication list given to you today.  Labwork: - none  Procedures/Testing: - none  Follow-Up: - Remote monitoring is used to monitor your Pacemaker of ICD from home. This monitoring reduces the number of office visits required to check your device to one time per year. It allows [place] to keep an eye on the functioning of your device to ensure it is working properly. You are scheduled for a device check from home on 07/31/15. You may send your transmission at any time that day. If you have a wireless device, the transmission will be sent automatically. After your physician reviews your transmission, you will receive a postcard with your next transmission date.  - Your physician wants you to follow-up in: 1 year with Chanetta Marshall, NP for Dr. Caryl Comes. You will receive a reminder letter in the mail two months in advance. If you don't receive a letter, please call our office to schedule the follow-up appointment.  - Your physician wants you to follow-up in: 2 years with Dr. Caryl Comes. You will receive a reminder letter in the mail two months in advance. If you don't receive a letter, please call our office to schedule the follow-up appointment.    Any Additional Special Instructions Will Be Listed Below (If Applicable). ** please call the office at [phone] to schedule a follow up appointment with the Coumadin Clinic **    If you need a refill on your cardiac medications before your next appointment, please call your pharmacy.

## 2015-05-01 NOTE — Telephone Encounter (Signed)
Dr. Caryl Comes called and advised the patient's son that he asked the patient specifically today if we could bring him back and speak with him and the patient stated "no." Dr. Caryl Comes advised that we receive a copy of her POA to have this on file. Patient's son verbalized understanding.

## 2015-05-02 LAB — CUP PACEART INCLINIC DEVICE CHECK
Battery Remaining Longevity: 120 mo
Brady Statistic RV Percent Paced: 92 %
Date Time Interrogation Session: 20170320170226
Implantable Lead Location: 753859
Lead Channel Impedance Value: 487.5 Ohm
Lead Channel Pacing Threshold Amplitude: 0.625 V
Lead Channel Sensing Intrinsic Amplitude: 6.4 mV
Lead Channel Setting Pacing Amplitude: 0.875
Lead Channel Setting Sensing Sensitivity: 2.5 mV
MDC IDC LEAD IMPLANT DT: 20120210
MDC IDC LEAD IMPLANT DT: 20120210
MDC IDC LEAD LOCATION: 753860
MDC IDC MSMT BATTERY VOLTAGE: 2.95 V
MDC IDC MSMT LEADCHNL RV PACING THRESHOLD PULSEWIDTH: 0.4 ms
MDC IDC PG SERIAL: 7209799
MDC IDC SET LEADCHNL RV PACING PULSEWIDTH: 0.4 ms
MDC IDC STAT BRADY RA PERCENT PACED: 0 %
Pulse Gen Model: 2210

## 2015-07-31 ENCOUNTER — Encounter: Payer: Medicare Other | Admitting: *Deleted

## 2015-07-31 ENCOUNTER — Telehealth: Payer: Self-pay | Admitting: Cardiology

## 2015-07-31 NOTE — Telephone Encounter (Signed)
Confirmed remote transmission w/ pt son.    

## 2015-08-04 ENCOUNTER — Encounter: Payer: Self-pay | Admitting: Cardiology

## 2015-08-30 ENCOUNTER — Ambulatory Visit: Payer: Self-pay | Admitting: Cardiology

## 2015-08-30 DIAGNOSIS — I482 Chronic atrial fibrillation, unspecified: Secondary | ICD-10-CM

## 2015-08-30 DIAGNOSIS — Z5181 Encounter for therapeutic drug level monitoring: Secondary | ICD-10-CM

## 2019-03-01 ENCOUNTER — Encounter: Payer: Self-pay | Admitting: Cardiology

## 2019-07-13 DEATH — deceased
